# Patient Record
Sex: Male | Born: 1965
Health system: Southern US, Community
[De-identification: ages and names within clinical notes are randomized; demographics above are authoritative.]

## PROBLEM LIST (undated history)

## (undated) DIAGNOSIS — K219 Gastro-esophageal reflux disease without esophagitis: Secondary | ICD-10-CM

## (undated) DIAGNOSIS — T7840XA Allergy, unspecified, initial encounter: Secondary | ICD-10-CM

## (undated) DIAGNOSIS — M1990 Unspecified osteoarthritis, unspecified site: Secondary | ICD-10-CM

## (undated) HISTORY — PX: OTHER SURGICAL HISTORY: SHX169

## (undated) HISTORY — DX: Allergy, unspecified, initial encounter: T78.40XA

## (undated) HISTORY — PX: NO PAST SURGERIES: SHX2092

## (undated) HISTORY — DX: Gastro-esophageal reflux disease without esophagitis: K21.9

---

## 2006-03-04 ENCOUNTER — Ambulatory Visit: Payer: Self-pay | Admitting: Family Medicine

## 2007-06-25 ENCOUNTER — Ambulatory Visit: Payer: Self-pay | Admitting: Family Medicine

## 2007-06-25 ENCOUNTER — Encounter: Payer: Self-pay | Admitting: Family Medicine

## 2007-06-25 DIAGNOSIS — R209 Unspecified disturbances of skin sensation: Secondary | ICD-10-CM | POA: Insufficient documentation

## 2007-08-08 ENCOUNTER — Ambulatory Visit: Payer: Self-pay | Admitting: Family Medicine

## 2007-08-08 DIAGNOSIS — R0602 Shortness of breath: Secondary | ICD-10-CM

## 2007-12-05 ENCOUNTER — Emergency Department (HOSPITAL_COMMUNITY): Admission: EM | Admit: 2007-12-05 | Discharge: 2007-12-05 | Payer: Self-pay | Admitting: Emergency Medicine

## 2008-10-01 ENCOUNTER — Telehealth: Payer: Self-pay | Admitting: Family Medicine

## 2008-10-26 ENCOUNTER — Ambulatory Visit: Payer: Self-pay | Admitting: Family Medicine

## 2008-10-26 DIAGNOSIS — L299 Pruritus, unspecified: Secondary | ICD-10-CM | POA: Insufficient documentation

## 2008-10-26 DIAGNOSIS — J40 Bronchitis, not specified as acute or chronic: Secondary | ICD-10-CM | POA: Insufficient documentation

## 2008-10-27 LAB — CONVERTED CEMR LAB
ALT: 22 units/L (ref 0–53)
AST: 22 units/L (ref 0–37)
Bilirubin, Direct: 0.1 mg/dL (ref 0.0–0.3)
Indirect Bilirubin: 0.3 mg/dL (ref 0.0–0.9)
Total Protein: 6.9 g/dL (ref 6.0–8.3)

## 2009-02-24 ENCOUNTER — Telehealth: Payer: Self-pay | Admitting: Family Medicine

## 2009-07-08 ENCOUNTER — Ambulatory Visit: Payer: Self-pay | Admitting: Family Medicine

## 2009-07-08 DIAGNOSIS — H6500 Acute serous otitis media, unspecified ear: Secondary | ICD-10-CM

## 2010-04-26 ENCOUNTER — Ambulatory Visit: Payer: Self-pay | Admitting: Family Medicine

## 2010-04-26 DIAGNOSIS — J45909 Unspecified asthma, uncomplicated: Secondary | ICD-10-CM | POA: Insufficient documentation

## 2010-04-26 DIAGNOSIS — L821 Other seborrheic keratosis: Secondary | ICD-10-CM | POA: Insufficient documentation

## 2010-05-24 ENCOUNTER — Ambulatory Visit: Payer: Self-pay | Admitting: Family Medicine

## 2010-06-08 ENCOUNTER — Encounter: Payer: Self-pay | Admitting: Family Medicine

## 2010-12-05 NOTE — Assessment & Plan Note (Signed)
Summary: asthma flare up   Vital Signs:  Patient profile:   45 year old male Height:      71 inches Weight:      188 pounds BMI:     26.32 O2 Sat:      96 % on Room air Temp:     98.4 degrees F oral Pulse rate:   64 / minute BP sitting:   106 / 70  (left arm) Cuff size:   large  Vitals Entered By: Payton Spark CMA (April 26, 2010 8:39 AM)  O2 Flow:  Room air  Serial Vital Signs/Assessments:  Comments: 8:40 AM Peak Flow [address] By: Payton Spark CMA   CC: Wheezing and cough x 3 weeks.   Primary Care Provider:  Nani Gasser  CC:  Wheezing and cough x 3 weeks.Marland Kitchen  History of Present Illness: 45 yo WM presents for dry cough and wheezing in the chest x 3 wks.  He denies any sore throat or runny nose.  He denies fevers but has had nightsweats with cough.  He has been coughing a lot at night.  No hemoptysis or sputum.  He has hx of seasonal allergies and asthma.  He takes Zyrtec each day.  He is not getting much relief from his inhaler.  He has some DOE.  Quit smoking in 89.      Current Medications (verified): 1)  Proventil Hfa 108 (90 Base) Mcg/act Aers (Albuterol Sulfate) .... 2-4 Puffs Inhaled Three Times A Day As Needed Sob. 2)  Zyrtec Allergy 10 Mg Tabs (Cetirizine Hcl) .Marland Kitchen.. 1 Tab By Mouth Once Daily  Allergies (verified): No Known Drug Allergies  Past History:  Past Medical History: seasonal allergies/ asthma  Social History: Reviewed history from 08/13/2006 and no changes required. 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  Quit smoking 1989, 2-4 EtOh/wk, no drugs, 1-3 caffeinated drinks/week, no reg exercise.  Review of Systems      See HPI  Physical Exam  General:  alert, well-developed, well-nourished, and well-hydrated.   Head:  normocephalic and atraumatic.   Eyes:  conjunctiva clear, wears glasses Ears:  EACs patent; TMs translucent and gray with good cone of light and bony  landmarks.  Nose:  no nasal discharge.   Mouth:  good dentition and pharynx pink and moist.  slight injection Neck:  no masses.   Lungs:  dry hacking cough, non labored with + bibasilar exp wheezing.  no rhonchi Heart:  normal rate, regular rhythm, and no murmur.   Skin:  color normal and no rashes.   1.6 cm brown flat speckled macule over the R temple Cervical Nodes:  No lymphadenopathy noted   Impression & Recommendations:  Problem # 1:  ASTHMA, SEASONAL (ICD-493.90)  Treat with Depo Medrol 80 mg IM x 1 now.  Start Dulera HFA (samples given) 1 puff two times a day.  PFs in the green zone (but he is worsening at night and w/ exertion).  Due to allergic component, he is to stay on Zyrtec each day.  Will RF his rescue inhaler to use 2 puffs 4 x a day for the next 10 days.  His updated medication list for this problem includes:    Proventil Hfa 108 (90 Base) Mcg/act Aers (Albuterol sulfate) .Marland Kitchen... 2-4 puffs inhaled three times a day as needed sob.  Orders: Depo- Medrol 80mg  (J1040) Admin of Therapeutic Inj  intramuscular or subcutaneous (16109) Peak Flow Rate (  94150)  Problem # 2:  SEBORRHEIC KERATOSIS (ICD-702.19)  Larger flat SK over R temple.  Will refer to derm to check this.  Orders: Dermatology Referral (Derma)  Complete Medication List: 1)  Proventil Hfa 108 (90 Base) Mcg/act Aers (Albuterol sulfate) .... 2-4 puffs inhaled three times a day as needed sob. 2)  Zyrtec Allergy 10 Mg Tabs (Cetirizine hcl) .Marland Kitchen.. 1 tab by mouth once daily 3)  Ventolin Hfa 108 (90 Base) Mcg/act Aers (Albuterol sulfate) .... 2 puffs 4 x a day  Patient Instructions: 1)  For seasonal asthma--> 2)  Treat with Depo Medrol 80 mg IM today. 3)  Start DULERA inhaler 1 puff two times a day until follow up. 4)  Use Ventolin rescue inhaler 2 puffs 4 x a day for the next 10 days then move to AS NEEDED. 5)  Return for f/u asthma in 3-4 wks. Prescriptions: VENTOLIN HFA 108 (90 BASE) MCG/ACT AERS (ALBUTEROL  SULFATE) 2 puffs 4 x a day  #1 x 1   Entered and Authorized by:   Seymour Bars DO   Signed by:   Seymour Bars DO on 04/26/2010   Method used:   Electronically to        [facility] [phone]* (retail)       [address]       [place], [place]  [address]       Ph: [phone] or [phone]       Fax: [phone]   RxID:   [id number]    Medication Administration  Injection # 1:    Medication: Depo- Medrol 80mg     Diagnosis: ASTHMA, SEASONAL (ICD-493.90)    Route: IM    Site: RUOQ gluteus    Exp Date: 09/2010    Lot #: [id number]    Patient tolerated injection without complications    Given by: Payton Spark CMA (April 26, 2010 9:16 AM)  Orders Added: 1)  Est. Patient Level III [93716] 2)  Depo- Medrol 80mg  [J1040] 3)  Admin of Therapeutic Inj  intramuscular or subcutaneous [96372] 4)  Dermatology Referral [Derma] 5)  Peak Flow Rate [94150]

## 2010-12-05 NOTE — Consult Note (Signed)
Summary: [facility]   Imported By: Lanelle Bal 06/27/2010 11:43:46  _____________________________________________________________________  External Attachment:    Type:   Image     Comment:   External Document

## 2010-12-05 NOTE — Assessment & Plan Note (Signed)
Summary: f/u asthma   Vital Signs:  Patient profile:   45 year old male Height:      71 inches Weight:      188 pounds BMI:     26.32 O2 Sat:      96 % on Room air Temp:     98.1 degrees F oral Pulse rate:   74 / minute BP sitting:   113 / 75  (left arm) Cuff size:   large  Vitals Entered By: Payton Spark CMA (May 24, 2010 8:08 AM)  O2 Flow:  Room air  Serial Vital Signs/Assessments:  Comments: 8:08 AM Peak Flow [address] By: Payton Spark CMA   CC: F/U asthma.    Primary Care Provider:  Nani Gasser  CC:  F/U asthma. .  History of Present Illness: 45 yo WM presents for f/u asthma.  Started on Ahmeek 1 month ago which has helped.  Staying on the Zyrtec.  Allergies have been worse wtih the heat which is triggering his asthma.  Using Albuterol HFA 2-3 x a day.  Denies nocturnal cough or DOE.  Still wheezes sometimes.  Has some postnasal drip and dry daytime cough.   Current Medications (verified): 1)  Proventil Hfa 108 (90 Base) Mcg/act Aers (Albuterol Sulfate) .... 2-4 Puffs Inhaled Three Times A Day As Needed Sob. 2)  Zyrtec Allergy 10 Mg Tabs (Cetirizine Hcl) .Marland Kitchen.. 1 Tab By Mouth Once Daily 3)  Ventolin Hfa 108 (90 Base) Mcg/act Aers (Albuterol Sulfate) .... 2 Puffs 4 X A Day  Allergies (verified): No Known Drug Allergies  Past History:  Past Medical History: Reviewed history from 04/26/2010 and no changes required. seasonal allergies/ asthma  Social History: Reviewed history from 08/13/2006 and no changes required. 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  Quit smoking 1989, 2-4 EtOh/wk, no drugs, 1-3 caffeinated drinks/week, no reg exercise.  Review of Systems      See HPI  Physical Exam  General:  alert, well-developed, well-nourished, and well-hydrated.   Head:  normocephalic and atraumatic.  sinuses NTTP Eyes:  conjunctiva clear Nose:  scant rhinorrhea Mouth:  o/p pink and moist,  slight injection with postnasal drip Neck:  no masses.   Chest Wall:  no tenderness.   Lungs:  Normal respiratory effort, chest expands symmetrically. Lungs are clear to auscultation, no crackles or wheezes.  + forced exp wheeze Heart:  Normal rate and regular rhythm. S1 and S2 normal without gallop, murmur, click, rub or other extra sounds. Skin:  color normal.   Cervical Nodes:  No lymphadenopathy noted   Impression & Recommendations:  Problem # 1:  ASTHMA, SEASONAL (ICD-493.90)  Seasonal asthma flare from allergies.  Continue Dulera - 2 puffs two times a day for maintenance with use of Zyrtec + sample of Veramyst for allergies.  RF Albuterol HFA for rescue.  PFs in green zone.  Plans to get flu shot at work this fall.  F/U in 3 mos. The following medications were removed from the medication list:    Proventil Hfa 108 (90 Base) Mcg/act Aers (Albuterol sulfate) .Marland Kitchen... 2-4 puffs inhaled three times a day as needed sob. His updated medication list for this problem includes:    Ventolin Hfa 108 (90 Base) Mcg/act Aers (Albuterol sulfate) .Marland Kitchen... 2 puffs q 4-6 hrs prn    Dulera 100-5 Mcg/act Aero (Mometasone furo-formoterol fum) .Marland Kitchen... 2 puffs two times a day (rinse mouth out after each use)  Orders: Peak Flow Rate (94150)  Complete Medication List: 1)  Zyrtec Allergy 10 Mg Tabs (Cetirizine hcl) .Marland Kitchen.. 1 tab by mouth once daily 2)  Ventolin Hfa 108 (90 Base) Mcg/act Aers (Albuterol sulfate) .... 2 puffs q 4-6 hrs prn 3)  Dulera 100-5 Mcg/act Aero (Mometasone furo-formoterol fum) .... 2 puffs two times a day (rinse mouth out after each use) 4)  Veramyst 27.5 Mcg/spray Susp (Fluticasone furoate) .... 2 sprays per nostril daily  Patient Instructions: 1)  Stay on Dulera 2 puffs 2 x a day everyday for asthma maintenance. 2)  Use Ventolin resue inhaler as needed. 3)  Stay on zyrtec each night. 4)  Add Veramyst 2 sprays/ nostril daily (sample). 5)  Return for f/u asthma/ flu shot in 3  mos. Prescriptions: VENTOLIN HFA 108 (90 BASE) MCG/ACT AERS (ALBUTEROL SULFATE) 2 puffs q 4-6 hrs prn  #2 x 0   Entered and Authorized by:   Seymour Bars DO   Signed by:   Seymour Bars DO on 05/24/2010   Method used:   [facility]ron[facility] to        [facility] [phone][facility] (retail)       [address]       [place], [place]  [address]       Ph: [phone] or [phone]       Fax: [phone]   RxID:   [id number]

## 2011-10-08 ENCOUNTER — Emergency Department (INDEPENDENT_AMBULATORY_CARE_PROVIDER_SITE_OTHER)
Admission: EM | Admit: 2011-10-08 | Discharge: 2011-10-08 | Disposition: A | Discharge status: Home / Self Care | Payer: BC Managed Care – PPO | Source: Home / Self Care | Attending: Emergency Medicine | Admitting: Emergency Medicine

## 2011-10-08 ENCOUNTER — Encounter: Payer: Self-pay | Admitting: *Deleted

## 2011-10-08 DIAGNOSIS — J209 Acute bronchitis, unspecified: Secondary | ICD-10-CM

## 2011-10-08 DIAGNOSIS — J111 Influenza due to unidentified influenza virus with other respiratory manifestations: Secondary | ICD-10-CM

## 2011-10-08 HISTORY — DX: Unspecified osteoarthritis, unspecified site: M19.90

## 2011-10-08 MED ORDER — OSELTAMIVIR PHOSPHATE 75 MG PO CAPS: ORAL_CAPSULE | ORAL | Status: AC

## 2011-10-08 MED ORDER — PROMETHAZINE-CODEINE 6.25-10 MG/5ML PO SYRP: ORAL_SOLUTION | ORAL | Status: AC

## 2011-10-08 MED ORDER — FLUTICASONE PROPIONATE 50 MCG/ACT NA SUSP: NASAL | Status: DC

## 2011-10-08 MED ORDER — CLARITHROMYCIN 500 MG PO TABS: ORAL_TABLET | ORAL | Status: AC

## 2011-10-08 MED ORDER — ALBUTEROL SULFATE HFA 108 (90 BASE) MCG/ACT IN AERS: 2.0000 | INHALATION_SPRAY | RESPIRATORY_TRACT | Status: DC | PRN

## 2011-10-08 NOTE — ED Provider Notes (Signed)
History     CSN: [id number] Arrival date & time: 10/08/2011 10:17 AM   First MD Initiated Contact with Patient 10/08/11 1054      Chief Complaint  Patient presents with  . Cough    with SOB at rest  . Generalized Body Aches    (Consider location/radiation/quality/duration/timing/severity/associated sxs/prior treatment) HPI Comments: HPI : Flu symptoms for about 1 day. Fever to 102 with chills, sweats, myalgias, fatigue, headache. Symptoms are progressively worsening, despite trying OTC fever reducing medicine and rest and fluids. Has decreased appetite, but tolerating some liquids by mouth. No history of recent tick bite.  Review of Systems: Positive for fatigue, mild nasal congestion, mild sore throat, mild swollen anterior neck glands, mild cough. Negative for acute vision changes, stiff neck, focal weakness, syncope, seizures, respiratory distress, vomiting, diarrhea, GU symptoms.  He has a history of exercise induced and seasonal asthma. Does not use any asthma medicines regularly. However over the past day, has used albuterol inhaler 2-3 times daily as needed for wheezing. His wheezing today has been very minimal, denies significant shortness of breath.  Patient is a 45 y.o. male presenting with cough. The history is provided by the patient.  Cough The cough is productive of sputum. The maximum temperature recorded prior to his arrival was 100 to 100.9 F.    Past Medical History  Diagnosis Date  . Arthritis   . Asthma     History reviewed. No pertinent past surgical history.  Family History  Problem Relation Age of Onset  . Diabetes Mother     History  Substance Use Topics  . Smoking status: Former Smoker    Quit date: 10/07/1988  . Smokeless tobacco: Not on file  . Alcohol Use: Yes     5/week      Review of Systems  Respiratory: Positive for cough.     Allergies  Review of patient's allergies indicates no known allergies.  Home Medications    Current Outpatient Rx  Name Route Sig Dispense Refill  . ALBUTEROL SULFATE (2.5 MG/3ML) 0.083% IN NEBU Nebulization Take 2.5 mg by nebulization every 6 (six) hours as needed.        BP 124/79  Pulse 100  Temp(Src) 99.9 F (37.7 C) (Oral)  Resp 18  Ht 5' 11.5" (1.816 m)  Wt 186 lb (84.369 kg)  BMI 25.58 kg/m2  SpO2 97%  Physical Exam  Nursing note and vitals reviewed. Constitutional: He appears well-developed and well-nourished.  Non-toxic appearance. He appears ill (very fatigued, but no cardiorespiratory distress). No distress.  HENT:  Head: Normocephalic and atraumatic.  Right Ear: Tympanic membrane and external ear normal.  Left Ear: Tympanic membrane and external ear normal.  Nose: Rhinorrhea present.  Mouth/Throat: Mucous membranes are normal. Posterior oropharyngeal erythema (mild redness ) present. No oropharyngeal exudate.  Eyes: Conjunctivae are normal. Right eye exhibits no discharge. Left eye exhibits no discharge. No scleral icterus.  Neck: Neck supple.  Cardiovascular: Normal rate, regular rhythm and normal heart sounds.   Pulmonary/Chest: No stridor. No respiratory distress. He has wheezes (Rarely expiratory wheezes, but good air movement). He has rhonchi (Mild anterior rhonchi). He has no rales.  Abdominal: Soft. There is no tenderness.  Musculoskeletal: He exhibits no edema.  Lymphadenopathy:    He has cervical adenopathy (mild shoddy anterior cervical nodes).  Neurological: He is alert.  Skin: Skin is warm and intact. No rash noted. He is diaphoretic.  Psychiatric: He has a normal mood and affect.    ED  Course  Procedures (including critical care time)  Labs Reviewed - No data to display No results found.   No diagnosis found.    MDM  He likely has influenza, as there is recently been outbreak of influenza. We discussed options and he prefers not to run flu test and I am agreeable to prescribe Tamiflu. Also, given his mild asthma and clinically  has mild acute bronchitis, will cover bacterial causes with Biaxin. I refilled his albuterol inhaler. Phenergan with codeine cough syrup. Other symptomatic care discussed. Followup with Dr. Eppie Gibson within one week, sooner if worse or new symptoms.        Lonell Face, MD 10/08/11 [phone]

## 2011-11-29 ENCOUNTER — Encounter: Payer: Self-pay | Admitting: Family Medicine

## 2011-11-29 ENCOUNTER — Ambulatory Visit (INDEPENDENT_AMBULATORY_CARE_PROVIDER_SITE_OTHER): Payer: BC Managed Care – PPO | Admitting: Family Medicine

## 2011-11-29 VITALS — BP 136/82 | HR 84 | Temp 98.1°F | Ht 71.0 in | Wt 186.0 lb

## 2011-11-29 DIAGNOSIS — R21 Rash and other nonspecific skin eruption: Secondary | ICD-10-CM

## 2011-11-29 DIAGNOSIS — L309 Dermatitis, unspecified: Secondary | ICD-10-CM

## 2011-11-29 DIAGNOSIS — L259 Unspecified contact dermatitis, unspecified cause: Secondary | ICD-10-CM

## 2011-11-29 MED ORDER — EUCERIN EX CREA: TOPICAL_CREAM | CUTANEOUS | Status: DC | PRN

## 2011-11-29 MED ORDER — MOMETASONE FUROATE 0.1 % EX CREA: TOPICAL_CREAM | CUTANEOUS | Status: AC

## 2011-11-29 NOTE — Progress Notes (Signed)
Subjective:     Patient ID: Harry Lewis, male   DOB: [birth year], 46 y.o.   MRN: [id number]  Rash This is a new (Rash will reoccur yaerly durning the winter but this year a lot worse more of a patch) problem. The current episode started 1 to 4 weeks ago (3 weeks ). The problem has been gradually worsening since onset. The affected locations include the left lower leg and right lower leg. The rash is characterized by dryness, redness and itchiness. He was exposed to nothing. Pertinent negatives include no anorexia, congestion, cough, diarrhea, eye pain, facial edema, fatigue, fever, joint pain, nail changes, rhinorrhea, shortness of breath, sore throat or vomiting. Past treatments include nothing. His past medical history is significant for asthma. There is no history of allergies, eczema or varicella.     Review of Systems  Constitutional: Negative for fever and fatigue.  HENT: Negative for congestion, sore throat and rhinorrhea.   Eyes: Negative for pain.  Respiratory: Negative for cough and shortness of breath.   Gastrointestinal: Negative for vomiting, diarrhea and anorexia.  Musculoskeletal: Negative for joint pain.  Skin: Positive for rash. Negative for nail changes.  All other systems reviewed and are negative.       Objective:   Physical Exam  Skin: Skin is warm and dry. Rash noted. There is erythema.       Patch like rash w/ excorations on the L leg w/some areas on the R leg as well.  Psychiatric: He has a normal mood and affect. His behavior is normal.       Assessment:     eczema    Plan:     Eucerin cram and elcocon

## 2011-11-29 NOTE — Patient Instructions (Signed)

## 2013-03-03 ENCOUNTER — Other Ambulatory Visit: Payer: Self-pay | Admitting: *Deleted

## 2013-03-03 MED ORDER — ALBUTEROL SULFATE HFA 108 (90 BASE) MCG/ACT IN AERS: 2.0000 | INHALATION_SPRAY | RESPIRATORY_TRACT | Status: DC | PRN

## 2013-11-19 ENCOUNTER — Emergency Department
Admission: EM | Admit: 2013-11-19 | Discharge: 2013-11-19 | Disposition: A | Discharge status: Home / Self Care | Payer: BC Managed Care – PPO | Source: Home / Self Care | Attending: Family Medicine | Admitting: Family Medicine

## 2013-11-19 ENCOUNTER — Encounter: Payer: Self-pay | Admitting: Emergency Medicine

## 2013-11-19 DIAGNOSIS — J069 Acute upper respiratory infection, unspecified: Secondary | ICD-10-CM

## 2013-11-19 DIAGNOSIS — J9801 Acute bronchospasm: Secondary | ICD-10-CM

## 2013-11-19 DIAGNOSIS — J40 Bronchitis, not specified as acute or chronic: Secondary | ICD-10-CM

## 2013-11-19 MED ORDER — METHYLPREDNISOLONE ACETATE 80 MG/ML IJ SUSP
80.0000 mg | Freq: Once | INTRAMUSCULAR | Status: AC
  Administered 2013-11-19: 80 mg via INTRAMUSCULAR

## 2013-11-19 MED ORDER — AZITHROMYCIN 250 MG PO TABS: ORAL_TABLET | ORAL | Status: DC

## 2013-11-19 MED ORDER — ALBUTEROL SULFATE HFA 108 (90 BASE) MCG/ACT IN AERS: 2.0000 | INHALATION_SPRAY | RESPIRATORY_TRACT | Status: DC | PRN

## 2013-11-19 NOTE — ED Notes (Signed)
Patient c/o dry cough, rhinitis, sob and fatigue x 6 days. Patient states he has tried otc Robitussin DM, Nyquil and Mucinex without relief.

## 2013-11-19 NOTE — ED Provider Notes (Signed)
CSN: [id number]     Arrival date & time 11/19/13  1732 History   First MD Initiated Contact with Patient 11/19/13 1743     Chief Complaint  Patient presents with  . Cough  . Shortness of Breath    HPI  URI Symptoms Onset: 7 days  Description: rhinorrhea, nasal congestion, cough, chest tightness Modifying factors:  Baseline hx/o asthma use, no wheezing, + chest tightness   Symptoms Nasal discharge: yes Fever: no Sore throat: no Cough: yes Wheezing: no Ear pain: no GI symptoms: no Sick contacts: no  Red Flags  Stiff neck: no Dyspnea: minimal  Rash: no Swallowing difficulty: no  Sinusitis Risk Factors Headache/face pain: no Double sickening: no tooth pain: no  Allergy Risk Factors Sneezing: no Itchy scratchy throat: no Seasonal symptoms: no  Flu Risk Factors Headache: no muscle aches: no severe fatigue: no   Past Medical History  Diagnosis Date  . Arthritis   . Asthma    History reviewed. No pertinent past surgical history. Family History  Problem Relation Age of Onset  . Diabetes Mother   . Stroke Mother    History  Substance Use Topics  . Smoking status: Former Smoker    Quit date: 10/07/1988  . Smokeless tobacco: Not on file  . Alcohol Use: Yes     Comment: 5/week    Review of Systems  All other systems reviewed and are negative.    Allergies  Review of patient's allergies indicates no known allergies.  Home Medications   Current Outpatient Rx  Name  Route  Sig  Dispense  Refill  . albuterol (PROVENTIL HFA;VENTOLIN HFA) 108 (90 BASE) MCG/ACT inhaler   Inhalation   Inhale 2 puffs into the lungs every 4 (four) hours as needed for wheezing.   18 g   0   . albuterol (PROVENTIL) (2.5 MG/3ML) 0.083% nebulizer solution   Nebulization   Take 2.5 mg by nebulization every 6 (six) hours as needed.           Marland Kitchen EXPIRED: fluticasone (FLONASE) 50 MCG/ACT nasal spray      1 or 2 sprays each nostril twice a day   16 g   0   . EXPIRED:  Skin Protectants, Misc. (EUCERIN) cream   Topical   Apply topically as needed for dry skin. Take 16 oz of Eucerin cream and mix w/elocon cream   454 g   2    BP 128/86  Pulse 81  Temp(Src) 98.8 F (37.1 C) (Oral)  Resp 20  Ht 5\' 11"  (1.803 m)  Wt 189 lb 4 oz (85.843 kg)  BMI 26.41 kg/m2  SpO2 98% Physical Exam  Constitutional: He is oriented to person, place, and time. He appears well-developed and well-nourished.  HENT:  Head: Normocephalic and atraumatic.  Right Ear: External ear normal.  Left Ear: External ear normal.  +nasal erythema, rhinorrhea bilaterally, + post oropharyngeal erythema    Eyes: Conjunctivae are normal. Pupils are equal, round, and reactive to light.  Neck: Normal range of motion. Neck supple.  Cardiovascular: Normal rate, regular rhythm and normal heart sounds.   Pulmonary/Chest: Effort normal and breath sounds normal.  Abdominal: Soft.  Musculoskeletal: Normal range of motion.  Neurological: He is alert and oriented to person, place, and time.  Skin: Skin is warm.    ED Course  Procedures (including critical care time) Labs Review Labs Reviewed - No data to display Imaging Review No results found.  EKG Interpretation    Date/Time:  Ventricular Rate:    PR Interval:    QRS Duration:   QT Interval:    QTC Calculation:   R Axis:     Text Interpretation:              MDM   1. URI (upper respiratory infection)   2. Bronchitis   3. Bronchospasm    WIll treat with zpak Depomedrol 80mg  IM x1 given bronchospasmodic component to sxs.  Prn albuterol refilled.  Discussed supportive care and infectious/resp red flags.  Follow up as needed.     The patient and/or caregiver has been counseled thoroughly with regard to treatment plan and/or medications prescribed including dosage, schedule, interactions, rationale for use, and possible side effects and they verbalize understanding. Diagnoses and expected course of recovery discussed  and will return if not improved as expected or if the condition worsens. Patient and/or caregiver verbalized understanding.         Shanda Howells, MD 11/19/13 [phone]

## 2014-10-11 ENCOUNTER — Emergency Department
Admission: EM | Admit: 2014-10-11 | Discharge: 2014-10-11 | Disposition: A | Discharge status: Home / Self Care | Payer: BC Managed Care – PPO | Source: Home / Self Care | Attending: Family Medicine | Admitting: Family Medicine

## 2014-10-11 ENCOUNTER — Encounter: Payer: Self-pay | Admitting: *Deleted

## 2014-10-11 DIAGNOSIS — R197 Diarrhea, unspecified: Secondary | ICD-10-CM

## 2014-10-11 MED ORDER — DIPHENOXYLATE-ATROPINE 2.5-0.025 MG PO TABS: 1.0000 | ORAL_TABLET | Freq: Four times a day (QID) | ORAL | Status: DC | PRN

## 2014-10-11 MED ORDER — CIPROFLOXACIN HCL 500 MG PO TABS: 500.0000 mg | ORAL_TABLET | Freq: Two times a day (BID) | ORAL | Status: DC

## 2014-10-11 NOTE — Discharge Instructions (Signed)
Thank you for coming in today. Take Cipro twice daily for 5 days. Use Lomotil up to 4 times daily for diarrhea. Do not take if you have severe abdominal pain or blood in the stool. Return your stool kit ASAP  If your belly pain worsens, or you have high fever, bad vomiting, blood in your stool or black tarry stool go to the Emergency Room.   Diarrhea Diarrhea is frequent loose and watery bowel movements. It can cause you to feel weak and dehydrated. Dehydration can cause you to become tired and thirsty, have a dry mouth, and have decreased urination that often is dark yellow. Diarrhea is a sign of another problem, most often an infection that will not last long. In most cases, diarrhea typically lasts 2-3 days. However, it can last longer if it is a sign of something more serious. It is important to treat your diarrhea as directed by your caregiver to lessen or prevent future episodes of diarrhea. CAUSES  Some common causes include:  Gastrointestinal infections caused by viruses, bacteria, or parasites.  Food poisoning or food allergies.  Certain medicines, such as antibiotics, chemotherapy, and laxatives.  Artificial sweeteners and fructose.  Digestive disorders. HOME CARE INSTRUCTIONS  Ensure adequate fluid intake (hydration): Have 1 cup (8 oz) of fluid for each diarrhea episode. Avoid fluids that contain simple sugars or sports drinks, fruit juices, whole milk products, and sodas. Your urine should be clear or pale yellow if you are drinking enough fluids. Hydrate with an oral rehydration solution that you can purchase at pharmacies, retail stores, and online. You can prepare an oral rehydration solution at home by mixing the following ingredients together:   - tsp table salt.   tsp baking soda.   tsp salt substitute containing potassium chloride.  1  tablespoons sugar.  1 L (34 oz) of water.  Certain foods and beverages may increase the speed at which food moves through the  gastrointestinal (GI) tract. These foods and beverages should be avoided and include:  Caffeinated and alcoholic beverages.  High-fiber foods, such as raw fruits and vegetables, nuts, seeds, and whole grain breads and cereals.  Foods and beverages sweetened with sugar alcohols, such as xylitol, sorbitol, and mannitol.  Some foods may be well tolerated and may help thicken stool including:  Starchy foods, such as rice, toast, pasta, low-sugar cereal, oatmeal, grits, baked potatoes, crackers, and bagels.  Bananas.  Applesauce.  Add probiotic-rich foods to help increase healthy bacteria in the GI tract, such as yogurt and fermented milk products.  Wash your hands well after each diarrhea episode.  Only take over-the-counter or prescription medicines as directed by your caregiver.  Take a warm bath to relieve any burning or pain from frequent diarrhea episodes. SEEK IMMEDIATE MEDICAL CARE IF:   You are unable to keep fluids down.  You have persistent vomiting.  You have blood in your stool, or your stools are black and tarry.  You do not urinate in 6-8 hours, or there is only a small amount of very dark urine.  You have abdominal pain that increases or localizes.  You have weakness, dizziness, confusion, or light-headedness.  You have a severe headache.  Your diarrhea gets worse or does not get better.  You have a fever or persistent symptoms for more than 2-3 days.  You have a fever and your symptoms suddenly get worse. MAKE SURE YOU:   Understand these instructions.  Will watch your condition.  Will get help right away if  you are not doing well or get worse. Document Released: 10/12/2002 Document Revised: 03/08/2014 Document Reviewed: 06/29/2012 [facility] Patient Information 2015 [place], [place]. This information is not intended to replace advice given to you by your health care provider. Make sure you discuss any questions you have with your health care provider.

## 2014-10-11 NOTE — ED Notes (Signed)
Bandy c/o diarrhea x 3 days. Unable to hold any food or fluids. C/o chills. Taken imodium without relief.

## 2014-10-11 NOTE — ED Provider Notes (Signed)
Harry Lewis is a 48 y.o. male who presents to Urgent Care today for diarrhea. Patient has 3 day history of painless nonbloody diarrhea. No vomiting. No abdominal pain. No fevers. Patient does note chills. No other sick contacts. No recent travel or camping. No recent antibiotic exposure. He is drinking normally but eating causes frequent diarrhea. He has tried over-the-counter Imodium which has not helped.   Past Medical History  Diagnosis Date  . Arthritis   . Asthma    History reviewed. No pertinent past surgical history. History  Substance Use Topics  . Smoking status: Former Smoker    Quit date: 10/07/1988  . Smokeless tobacco: Never Used  . Alcohol Use: Yes     Comment: 5/week   ROS as above Medications: No current facility-administered medications for this encounter.   Current Outpatient Prescriptions  Medication Sig Dispense Refill  . albuterol (PROVENTIL HFA;VENTOLIN HFA) 108 (90 BASE) MCG/ACT inhaler Inhale 2 puffs into the lungs every 4 (four) hours as needed for wheezing or shortness of breath (cough, shortness of breath or wheezing.). 1 Inhaler 1  . ciprofloxacin (CIPRO) 500 MG tablet Take 1 tablet (500 mg total) by mouth every 12 (twelve) hours. 10 tablet 0  . diphenoxylate-atropine (LOMOTIL) 2.5-0.025 MG per tablet Take 1 tablet by mouth 4 (four) times daily as needed for diarrhea or loose stools. 30 tablet 0  . fluticasone (FLONASE) 50 MCG/ACT nasal spray 1 or 2 sprays each nostril twice a day 16 g 0  . Skin Protectants, Misc. (EUCERIN) cream Apply topically as needed for dry skin. Take 16 oz of Eucerin cream and mix w/elocon cream 454 g 2   No Known Allergies   Exam:  BP 121/81 mmHg  Pulse 72  Temp(Src) 97.7 F (36.5 C) (Oral)  Resp 16  Wt 184 lb (83.462 kg)  SpO2 98% Gen: Well NAD HEENT: EOMI,  MMM Lungs: Normal work of breathing. CTABL Heart: RRR no MRG Abd: NABS, Soft. Nondistended, Nontender no rebound or guarding or masses. Exts: Brisk capillary  refill, warm and well perfused.   No results found for this or any previous visit (from the past 24 hour(s)). No results found.  Assessment and Plan: 48 y.o. male with diarrhea. Unclear etiology. 3 days is a bit long for viral cause. Stool culture and C. difficile pending. Treatment with Cipro and Lomotil.  Discussed warning signs or symptoms. Please see discharge instructions. Patient expresses understanding.     Gregor Hams, MD 10/11/14 [phone]

## 2014-10-15 ENCOUNTER — Telehealth: Payer: Self-pay | Admitting: Emergency Medicine

## 2014-10-15 LAB — STOOL CULTURE

## 2015-03-28 ENCOUNTER — Encounter: Payer: Self-pay | Admitting: Family Medicine

## 2015-03-28 ENCOUNTER — Ambulatory Visit (INDEPENDENT_AMBULATORY_CARE_PROVIDER_SITE_OTHER): Payer: BLUE CROSS/BLUE SHIELD | Admitting: Family Medicine

## 2015-03-28 VITALS — BP 137/82 | HR 70 | Wt 189.0 lb

## 2015-03-28 DIAGNOSIS — Z0000 Encounter for general adult medical examination without abnormal findings: Secondary | ICD-10-CM | POA: Diagnosis not present

## 2015-03-28 NOTE — Patient Instructions (Addendum)
Colonoscopy A colonoscopy is an exam to look at the entire large intestine (colon). This exam can help find problems such as tumors, polyps, inflammation, and areas of bleeding. The exam takes about 1 hour.  LET [facility] CARE PROVIDER KNOW ABOUT:   Any allergies you have.  All medicines you are taking, including vitamins, herbs, eye drops, creams, and over-the-counter medicines.  Previous problems you or members of your family have had with the use of anesthetics.  Any blood disorders you have.  Previous surgeries you have had.  Medical conditions you have. RISKS AND COMPLICATIONS  Generally, this is a safe procedure. However, as with any procedure, complications can occur. Possible complications include:  Bleeding.  Tearing or rupture of the colon wall.  Reaction to medicines given during the exam.  Infection (rare). BEFORE THE PROCEDURE   Ask your health care provider about changing or stopping your regular medicines.  You may be prescribed an oral bowel prep. This involves drinking a large amount of medicated liquid, starting the day before your procedure. The liquid will cause you to have multiple loose stools until your stool is almost clear or light green. This cleans out your colon in preparation for the procedure.  Do not eat or drink anything else once you have started the bowel prep, unless your health care provider tells you it is safe to do so.  Arrange for someone to drive you home after the procedure. PROCEDURE   You will be given medicine to help you relax (sedative).  You will lie on your side with your knees bent.  A long, flexible tube with a light and camera on the end (colonoscope) will be inserted through the rectum and into the colon. The camera sends video back to a computer screen as it moves through the colon. The colonoscope also releases carbon dioxide gas to inflate the colon. This helps your health care provider see the area better.  During  the exam, your health care provider may take a small tissue sample (biopsy) to be examined under a microscope if any abnormalities are found.  The exam is finished when the entire colon has been viewed. AFTER THE PROCEDURE   Do not drive for 24 hours after the exam.  You may have a small amount of blood in your stool.  You may pass moderate amounts of gas and have mild abdominal cramping or bloating. This is caused by the gas used to inflate your colon during the exam.  Ask when your test results will be ready and how you will get your results. Make sure you get your test results. Document Released: 10/19/2000 Document Revised: 08/12/2013 Document Reviewed: 06/29/2013 [facility] Patient Information 2015 [place], [place]. This information is not intended to replace advice given to you by your health care provider. Make sure you discuss any questions you have with your health care provider.    Prostate-Specific Antigen The prostate-specific antigen (PSA) test is a blood test. It can be used to help diagnose prostate cancer in men whose physical exam results suggest the presence of prostate cancer. Some factors interfere with the results of the PSA. The factors listed below will either increase or decrease the PSA levels. They are:  Prescriptions used for male baldness.  Some herbs.  Active prostate infection.  Prior instrumentation or urinary catheterization.  Ejaculation up to 2 days prior to testing.  A noncancerous enlargement of the prostate.  Inflammation of the prostate.  Active urinary tract infection. If your test results  are high (elevated), your health care provider will discuss the results with you. He or she will also let you know if more evaluation is needed. PREPARATION FOR TEST No preparation or fasting is necessary. NORMAL FINDINGS Less than 4 ng/mL or less than 16mcg/L (SI units) Ranges for normal findings may vary among different labs and hospitals. You should  always check with your health care provider after having lab work or other tests done to discuss the meaning of your test results and whether your values are considered within normal limits. MEANING OF TEST  A normal value means prostate cancer is less likely. The chance of having prostate cancer increases if the value is between 4 ng/mL and 10 ng/mL. However, further testing will be needed. Values above 10 ng/mL suggest that there is a much higher chance of having prostate cancer (if the above situations that raise PSA are not present). Your health care provider will go over your test results with you and discuss the importance of this test. If this value is elevated, your health care provider may recommend further testing or evaluation. OBTAINING THE TEST RESULTS It is your responsibility to obtain your test results. Ask the lab or department performing the test when and how you will get your results. Document Released: 11/24/2004 Document Revised: 10/27/2013 Document Reviewed: 05/30/2007 [facility] Patient Information 2015 [place], [place]. This information is not intended to replace advice given to you by your health care provider. Make sure you discuss any questions you have with your health care provider.

## 2015-03-28 NOTE — Progress Notes (Signed)
Subjective:    Patient ID: Harry Lewis, male    DOB: [birth year], 49 y.o.   MRN: [id number]  [facility] Here for CPE - no specific concerns or complaints. He has not had a physical in almost 5 years. He did want to discuss when to start screening for colon cancer and prostate cancer. He denies any urinary symptoms.   Review of Systems  Comprehensive review of systems is negative.  BP 137/82 mmHg  Pulse 70  Wt 189 lb (85.73 kg)  SpO2 97%    No Known Allergies  Past Medical History  Diagnosis Date  . Arthritis   . Asthma     Past Surgical History  Procedure Laterality Date  . No past surgeries      History   Social History  . Marital Status: Married    Spouse Name: N/A  . Number of Children: 0  . Years of Education: N/A   Occupational History  . chemist     Social History Main Topics  . Smoking status: Former Smoker    Quit date: 10/07/1988  . Smokeless tobacco: Never Used  . Alcohol Use: 3.6 oz/week    6 Cans of beer per week     Comment: 5/week  . Drug Use: No  . Sexual Activity:    Partners: Female   Other Topics Concern  . Not on file   Social History Narrative   Occ execise , mostly on the weekend.     Family History  Problem Relation Age of Onset  . Diabetes Mother   . Stroke Mother   . Throat cancer Father     smoker    Outpatient Encounter Prescriptions as of 03/28/2015  Medication Sig  . albuterol (PROVENTIL HFA;VENTOLIN HFA) 108 (90 BASE) MCG/ACT inhaler Inhale 2 puffs into the lungs every 4 (four) hours as needed for wheezing or shortness of breath (cough, shortness of breath or wheezing.).  . [DISCONTINUED] ciprofloxacin (CIPRO) 500 MG tablet Take 1 tablet (500 mg total) by mouth every 12 (twelve) hours.  . [DISCONTINUED] diphenoxylate-atropine (LOMOTIL) 2.5-0.025 MG per tablet Take 1 tablet by mouth 4 (four) times daily as needed for diarrhea or loose stools.  . [DISCONTINUED] fluticasone (FLONASE) 50 MCG/ACT nasal spray 1 or 2 sprays  each nostril twice a day  . [DISCONTINUED] Skin Protectants, Misc. (EUCERIN) cream Apply topically as needed for dry skin. Take 16 oz of Eucerin cream and mix w/elocon cream   No facility-administered encounter medications on file as of 03/28/2015.           Objective:   Physical Exam  Constitutional: He is oriented to person, place, and time. He appears well-developed and well-nourished.  HENT:  Head: Normocephalic and atraumatic.  Right Ear: External ear normal.  Left Ear: External ear normal.  Nose: Nose normal.  Mouth/Throat: Oropharynx is clear and moist.  Eyes: Conjunctivae and EOM are normal. Pupils are equal, round, and reactive to light.  Neck: Normal range of motion. Neck supple. No thyromegaly present.  Cardiovascular: Normal rate, regular rhythm, normal heart sounds and intact distal pulses.   Pulmonary/Chest: Effort normal and breath sounds normal.  Abdominal: Soft. Bowel sounds are normal. He exhibits no distension and no mass. There is no tenderness. There is no rebound and no guarding.  Musculoskeletal: Normal range of motion.  Lymphadenopathy:    He has no cervical adenopathy.  Neurological: He is alert and oriented to person, place, and time. He has normal reflexes.  Skin: Skin is  warm and dry.  Psychiatric: He has a normal mood and affect. His behavior is normal. Judgment and thought content normal.          Assessment & Plan:  CPE Keep up a regular exercise program and make sure you are eating a healthy diet Try to eat 4 servings of dairy a day, or if you are lactose intolerant take a calcium with vitamin D daily.  Your vaccines are up to date.  Discussed starting colon cancer screening and prostate cancer screening next year at age 75. We did go ahead and review some of that information today and handout was provided about screening colonoscopy.

## 2015-04-06 LAB — COMPLETE METABOLIC PANEL WITH GFR
ALBUMIN: 3.8 g/dL (ref 3.5–5.2)
ALT: 21 U/L (ref 0–53)
AST: 24 U/L (ref 0–37)
Alkaline Phosphatase: 64 U/L (ref 39–117)
BILIRUBIN TOTAL: 0.8 mg/dL (ref 0.2–1.2)
BUN: 17 mg/dL (ref 6–23)
CO2: 24 mEq/L (ref 19–32)
Calcium: 9.2 mg/dL (ref 8.4–10.5)
Chloride: 104 mEq/L (ref 96–112)
Creat: 1.2 mg/dL (ref 0.50–1.35)
GFR, EST NON AFRICAN AMERICAN: 71 mL/min
GFR, Est African American: 82 mL/min
GLUCOSE: 95 mg/dL (ref 70–99)
Potassium: 4.1 mEq/L (ref 3.5–5.3)
Sodium: 141 mEq/L (ref 135–145)
TOTAL PROTEIN: 6.4 g/dL (ref 6.0–8.3)

## 2015-04-06 LAB — LIPID PANEL
CHOLESTEROL: 215 mg/dL — AB (ref 0–200)
HDL: 61 mg/dL (ref >=40)
LDL Cholesterol: 116 mg/dL — ABNORMAL HIGH (ref 0–99)
Total CHOL/HDL Ratio: 3.5 Ratio
Triglycerides: 191 mg/dL — ABNORMAL HIGH (ref <150)
VLDL: 38 mg/dL (ref 0–40)

## 2015-04-06 LAB — HIV ANTIBODY (ROUTINE TESTING W REFLEX): HIV 1&2 Ab, 4th Generation: NONREACTIVE

## 2015-04-26 ENCOUNTER — Telehealth: Payer: Self-pay | Admitting: *Deleted

## 2015-04-26 NOTE — Telephone Encounter (Signed)
Pt called and lvm requesting copy  Of his results. Results printed and mailed.Harry Lewis [place]

## 2015-11-09 ENCOUNTER — Other Ambulatory Visit: Payer: Self-pay | Admitting: Family Medicine

## 2016-09-11 ENCOUNTER — Other Ambulatory Visit: Payer: Self-pay | Admitting: Family Medicine

## 2016-09-14 ENCOUNTER — Emergency Department
Admission: EM | Admit: 2016-09-14 | Discharge: 2016-09-14 | Disposition: A | Discharge status: Home / Self Care | Payer: BLUE CROSS/BLUE SHIELD | Source: Home / Self Care | Attending: Family Medicine | Admitting: Family Medicine

## 2016-09-14 ENCOUNTER — Emergency Department (INDEPENDENT_AMBULATORY_CARE_PROVIDER_SITE_OTHER): Payer: BLUE CROSS/BLUE SHIELD

## 2016-09-14 ENCOUNTER — Encounter: Payer: Self-pay | Admitting: Emergency Medicine

## 2016-09-14 DIAGNOSIS — R0789 Other chest pain: Secondary | ICD-10-CM

## 2016-09-14 DIAGNOSIS — R0781 Pleurodynia: Secondary | ICD-10-CM

## 2016-09-14 DIAGNOSIS — J40 Bronchitis, not specified as acute or chronic: Secondary | ICD-10-CM

## 2016-09-14 DIAGNOSIS — Z87891 Personal history of nicotine dependence: Secondary | ICD-10-CM | POA: Diagnosis not present

## 2016-09-14 DIAGNOSIS — R05 Cough: Secondary | ICD-10-CM

## 2016-09-14 DIAGNOSIS — Z8709 Personal history of other diseases of the respiratory system: Secondary | ICD-10-CM | POA: Diagnosis not present

## 2016-09-14 MED ORDER — AZITHROMYCIN 250 MG PO TABS: ORAL_TABLET | ORAL | 0 refills | Status: DC

## 2016-09-14 MED ORDER — PREDNISONE 20 MG PO TABS: ORAL_TABLET | ORAL | 0 refills | Status: DC

## 2016-09-14 MED ORDER — BENZONATATE 200 MG PO CAPS: 200.0000 mg | ORAL_CAPSULE | Freq: Three times a day (TID) | ORAL | 0 refills | Status: DC | PRN

## 2016-09-14 NOTE — ED Provider Notes (Signed)
[facility]    CSN: [id number] Arrival date & time: 09/14/16  0955     History   Chief Complaint Chief Complaint  Patient presents with  . Cough    HPI Harry Lewis is a 50 y.o. male.   Eight days ago patient developed typical cold-like symptoms developing over several days, including mild sore throat, sinus congestion, headache, fatigue, and cough.  He improved initially, but his non-productive cough gradually worsened.  Last night he developed sudden sharp pleuritic pain in his left lateral inferior chest that has persisted.  He denies fevers, chills, and sweats, and no shortness of breath or wheezing.  He has a history of mild asthma that usually manifests only during URI's.  He has started using his albuterol inhaler over the past 2 to 3 days.   The history is provided by the patient.    Past Medical History:  Diagnosis Date  . Arthritis   . Asthma     Patient Active Problem List   Diagnosis Date Noted  . ASTHMA, SEASONAL 04/26/2010  . SEBORRHEIC KERATOSIS 04/26/2010  . PRURITUS 10/26/2008  . SOB 08/08/2007    Past Surgical History:  Procedure Laterality Date  . NO PAST SURGERIES         Home Medications    Prior to Admission medications   Medication Sig Start Date End Date Taking? Authorizing Provider  azithromycin (ZITHROMAX Z-PAK) 250 MG tablet Take 2 tabs today; then begin one tab once daily for 4 more days. 09/14/16   Kandra Nicolas, MD  benzonatate (TESSALON) 200 MG capsule Take 1 capsule (200 mg total) by mouth 3 (three) times daily as needed for cough. 09/14/16   Kandra Nicolas, MD  predniSONE (DELTASONE) 20 MG tablet Take one tab by mouth twice daily for 5 days, then one daily for 3 days. Take with food. 09/14/16   Kandra Nicolas, MD  PROAIR HFA 108 [phone] Base) MCG/ACT inhaler 2 PUFF EVERY 4 HR AS NEEDED 09/12/16   Hali Marry, MD    Family History Family History  Problem Relation Age of Onset  . Diabetes Mother   .  Stroke Mother   . Throat cancer Father     smoker    Social History Social History  Substance Use Topics  . Smoking status: Former Smoker    Quit date: 10/07/1988  . Smokeless tobacco: Never Used  . Alcohol use 3.6 oz/week    6 Cans of beer per week     Comment: 5/week     Allergies   Patient has no known allergies.   Review of Systems Review of Systems + sore throat + cough + pleuritic pain No wheezing + nasal congestion + post-nasal drainage No sinus pain/pressure No itchy/red eyes No earache No hemoptysis No SOB No fever/chills No nausea No vomiting No abdominal pain No diarrhea No urinary symptoms No skin rash + fatigue + myalgias + headache Used OTC meds without relief   Physical Exam Triage Vital Signs ED Triage Vitals  Enc Vitals Group     BP 09/14/16 1044 137/81     Pulse Rate 09/14/16 1044 72     Resp --      Temp 09/14/16 1044 98.2 F (36.8 C)     Temp Source 09/14/16 1044 Oral     SpO2 09/14/16 1044 96 %     Weight 09/14/16 1045 191 lb (86.6 kg)     Height 09/14/16 1045 5\' 11"  (1.803 m)  Head Circumference --      Peak Flow --      Pain Score 09/14/16 1053 5     Pain Loc --      Pain Edu? --      Excl. in [place]? --    No data found.   Updated Vital Signs BP 137/81 (BP Location: Left Arm)   Pulse 72   Temp 98.2 F (36.8 C) (Oral)   Ht 5\' 11"  (1.803 m)   Wt 191 lb (86.6 kg)   SpO2 96%   BMI 26.64 kg/m   Visual Acuity Right Eye Distance:   Left Eye Distance:   Bilateral Distance:    Right Eye Near:   Left Eye Near:    Bilateral Near:     Physical Exam  Constitutional: He appears well-developed and well-nourished. No distress.  HENT:  Head: Normocephalic.  Right Ear: Tympanic membrane, external ear and ear canal normal.  Left Ear: Tympanic membrane, external ear and ear canal normal.  Nose: Nose normal.  Mouth/Throat: Oropharynx is clear and moist.  Eyes: Conjunctivae are normal. Pupils are equal, round, and  reactive to light.  Neck: Neck supple.  Lateral nodes tender to palpation  Cardiovascular: Normal heart sounds.   Pulmonary/Chest: Breath sounds normal.     He exhibits tenderness.    There is mild tenderness to palpation left lateral inferior chest as noted on diagram.   Abdominal: Soft. There is no tenderness.  Musculoskeletal: He exhibits tenderness. He exhibits no edema.  Lymphadenopathy:    He has cervical adenopathy.  Neurological: He is alert.  Skin: Skin is warm and dry.  Nursing note and vitals reviewed.    UC Treatments / Results  Labs (all labs ordered are listed, but only abnormal results are displayed) Labs Reviewed - No data to display  EKG  EKG Interpretation None       Radiology Dg Ribs Unilateral W/chest Left  Result Date: 09/14/2016 CLINICAL DATA:  Began coughing 5 days ago and had onset of left ribcage pain last night. Former smoker. History of asthma. EXAM: LEFT RIBS AND CHEST - 3+ VIEW COMPARISON:  None in PACs FINDINGS: The lungs are adequately inflated. There is no focal infiltrate. There is no pleural effusion or pneumothorax or pneumomediastinum. The heart and pulmonary vascularity are normal. Left rib detail images reveal no acute displaced or nondisplaced lower rib fractures. IMPRESSION: There is no active cardiopulmonary disease. No acute rib fracture is observed. Electronically Signed   By: David  Martinique M.D.   On: 09/14/2016 11:30    Procedures Procedures (including critical care time)  Medications Ordered in UC Medications - No data to display   Initial Impression / Assessment and Plan / UC Course  I have reviewed the triage vital signs and the nursing notes.  Pertinent labs & imaging results that were available during my care of the patient were reviewed by me and considered in my medical decision making (see chart for details).  Clinical Course   Dispensed rib belt. Begin prednisone burst/taper. Begin Z-pak for atypical  coverage, and prescription written for Benzonatate (Tessalon) to take at bedtime for night-time cough (may take daytime initially until chest pain improves). Take plain guaifenesin (1200mg  extended release tabs such as Mucinex) twice daily, with plenty of water, for cough and congestion.  May add Pseudoephedrine (30mg , one or two every 4 to 6 hours) for sinus congestion.  Get adequate rest.   May use Afrin nasal spray (or generic oxymetazoline) twice daily for  about 5 days and then discontinue.  Also recommend using saline nasal spray several times daily and saline nasal irrigation (AYR is a common brand).  Use Flonase nasal spray each morning after using Afrin nasal spray and saline nasal irrigation. Try warm salt water gargles for sore throat.  Stop all antihistamines for now, and other non-prescription cough/cold preparations. Continue albuterol inhaler as needed. Try to limit Tessalon (benzonatate) to bedtime use for cough. May take Tylenol as needed for pain. Apply ice pack to left chest for 20 to 30 minutes, 2 to 3 times daily  Continue until pain decreases.  Do not wear rib belt continuously. Follow-up with family doctor if not improving about 7 to 10 days.      Final Clinical Impressions(s) / UC Diagnoses   Final diagnoses:  Bronchitis  Rib pain on left side    New Prescriptions New Prescriptions   AZITHROMYCIN (ZITHROMAX Z-PAK) 250 MG TABLET    Take 2 tabs today; then begin one tab once daily for 4 more days.   BENZONATATE (TESSALON) 200 MG CAPSULE    Take 1 capsule (200 mg total) by mouth 3 (three) times daily as needed for cough.   PREDNISONE (DELTASONE) 20 MG TABLET    Take one tab by mouth twice daily for 5 days, then one daily for 3 days. Take with food.     Kandra Nicolas, MD 09/16/16 [phone]

## 2016-09-14 NOTE — Discharge Instructions (Signed)
Take plain guaifenesin (1200mg  extended release tabs such as Mucinex) twice daily, with plenty of water, for cough and congestion.  May add Pseudoephedrine (30mg , one or two every 4 to 6 hours) for sinus congestion.  Get adequate rest.   May use Afrin nasal spray (or generic oxymetazoline) twice daily for about 5 days and then discontinue.  Also recommend using saline nasal spray several times daily and saline nasal irrigation (AYR is a common brand).  Use Flonase nasal spray each morning after using Afrin nasal spray and saline nasal irrigation. Try warm salt water gargles for sore throat.  Stop all antihistamines for now, and other non-prescription cough/cold preparations. Continue albuterol inhaler as needed. Try to limit Tessalon (benzonatate) to bedtime use for cough. May take Tylenol as needed for pain. Apply ice pack to left chest for 20 to 30 minutes, 2 to 3 times daily  Continue until pain decreases.  Do not wear rib belt continuously. Follow-up with family doctor if not improving about 7 to 10 days.

## 2016-09-14 NOTE — ED Triage Notes (Signed)
Cough x 8 days, asthma, lots of drainage, coughed really hard last night and felt a sharp pain in left lower back.

## 2016-09-16 ENCOUNTER — Encounter: Payer: Self-pay | Admitting: Emergency Medicine

## 2016-09-16 ENCOUNTER — Emergency Department
Admission: EM | Admit: 2016-09-16 | Discharge: 2016-09-16 | Disposition: A | Discharge status: Home / Self Care | Payer: BLUE CROSS/BLUE SHIELD | Source: Home / Self Care | Attending: Family Medicine | Admitting: Family Medicine

## 2016-09-16 DIAGNOSIS — M940 Chondrocostal junction syndrome [Tietze]: Secondary | ICD-10-CM

## 2016-09-16 DIAGNOSIS — J40 Bronchitis, not specified as acute or chronic: Secondary | ICD-10-CM | POA: Diagnosis not present

## 2016-09-16 LAB — POCT CBC W AUTO DIFF (K'VILLE URGENT CARE)

## 2016-09-16 MED ORDER — LEVOFLOXACIN 750 MG PO TABS: 750.0000 mg | ORAL_TABLET | Freq: Every day | ORAL | 0 refills | Status: DC

## 2016-09-16 MED ORDER — METHYLPREDNISOLONE SODIUM SUCC 125 MG IJ SOLR: 125.0000 mg | Freq: Once | INTRAMUSCULAR | Status: DC

## 2016-09-16 NOTE — Discharge Instructions (Signed)
Stop azithromycin.  Resume prednisone tomorrow.  Continue guaifenesin with plenty of fluids.  Continue Tessalon perle at bedtime for cough. If symptoms become significantly worse during the night or over the weekend, proceed to the local emergency room.

## 2016-09-16 NOTE — ED Provider Notes (Signed)
[facility]    CSN: [id number] Arrival date & time: 09/16/16  1715     History   Chief Complaint Chief Complaint  Patient presents with  . Nasal Congestion    HPI Harry Lewis is a 50 y.o. male.   Patient returns for follow-up, having been evaluated/treated two days ago for bronchitis/left rib pain with azithromycin, prednisone, benzonatate at bedtime, Mucinex, and advised to use his albuterol inhaler as needed.   He returns today reporting a sensation of tightness in his upper anterior chest that occurs with deep inspiration, such that he has difficulty taking a deep inspiration.  The tightness is worse if he leans forward.  The pain does not radiate.  He had had pain in his left lateral chest which he states has now decreased.  No shortness of breath.  No wheezing.  No fevers, chills, and sweats.   The history is provided by the patient.    Past Medical History:  Diagnosis Date  . Arthritis   . Asthma     Patient Active Problem List   Diagnosis Date Noted  . ASTHMA, SEASONAL 04/26/2010  . SEBORRHEIC KERATOSIS 04/26/2010  . PRURITUS 10/26/2008  . SOB 08/08/2007    Past Surgical History:  Procedure Laterality Date  . NO PAST SURGERIES         Home Medications    Prior to Admission medications   Medication Sig Start Date End Date Taking? Authorizing Provider  azithromycin (ZITHROMAX Z-PAK) 250 MG tablet Take 2 tabs today; then begin one tab once daily for 4 more days. 09/14/16   Kandra Nicolas, MD  benzonatate (TESSALON) 200 MG capsule Take 1 capsule (200 mg total) by mouth 3 (three) times daily as needed for cough. 09/14/16   Kandra Nicolas, MD  levofloxacin (LEVAQUIN) 750 MG tablet Take 1 tablet (750 mg total) by mouth daily. 09/16/16   Kandra Nicolas, MD  predniSONE (DELTASONE) 20 MG tablet Take one tab by mouth twice daily for 5 days, then one daily for 3 days. Take with food. 09/14/16   Kandra Nicolas, MD  PROAIR HFA 108 [phone] Base)  MCG/ACT inhaler 2 PUFF EVERY 4 HR AS NEEDED 09/12/16   Hali Marry, MD    Family History Family History  Problem Relation Age of Onset  . Diabetes Mother   . Stroke Mother   . Throat cancer Father     smoker    Social History Social History  Substance Use Topics  . Smoking status: Former Smoker    Quit date: 10/07/1988  . Smokeless tobacco: Never Used  . Alcohol use 3.6 oz/week    6 Cans of beer per week     Comment: 5/week     Allergies   Patient has no known allergies.   Review of Systems Review of Systems No sore throat + cough ? pleuritic pain No wheezing + nasal congestion ? post-nasal drainage No sinus pain/pressure No itchy/red eyes No earache No hemoptysis No SOB No fever/chills No nausea No vomiting No abdominal pain No diarrhea No urinary symptoms No skin rash + fatigue No myalgias No headache Used OTC meds without relief   Physical Exam Triage Vital Signs ED Triage Vitals  Enc Vitals Group     BP 09/16/16 1731 128/79     Pulse Rate 09/16/16 1731 78     Resp 09/16/16 1731 18     Temp 09/16/16 1731 98 F (36.7 C)     Temp Source  09/16/16 1731 Oral     SpO2 09/16/16 1731 97 %     Weight --      Height --      Head Circumference --      Peak Flow --      Pain Score 09/16/16 1732 0     Pain Loc --      Pain Edu? --      Excl. in [place]? --    No data found.   Updated Vital Signs BP 128/79 (BP Location: Left Arm)   Pulse 78   Temp 98 F (36.7 C) (Oral)   Resp 18   SpO2 97%   Visual Acuity Right Eye Distance:   Left Eye Distance:   Bilateral Distance:    Right Eye Near:   Left Eye Near:    Bilateral Near:     Physical Exam Nursing notes and Vital Signs reviewed. Appearance:  Patient appears stated age, and in no acute distress Eyes:  Pupils are equal, round, and reactive to light and accomodation.  Extraocular movement is intact.  Conjunctivae are not inflamed  Ears:  Canals normal.  Tympanic membranes normal.    Nose:  Mildly congested turbinates.  No sinus tenderness.   Pharynx:  Normal Neck:  Supple.  Tender mildly enlarged posterior/lateral nodes are palpated bilaterally  Lungs:  Clear to auscultation.  Breath sounds are equal.  Moving air well.  No anterior chest tenderness to palpation.  SpO2: 97% Heart:  Regular rate and rhythm without murmurs, rubs, or gallops.  Rate 78 Abdomen:  Nontender without masses or hepatosplenomegaly.  Bowel sounds are present.  No CVA or flank tenderness.  Extremities:  No edema.  No lower leg/calf tenderness or warmth. Skin:  No rash present.    UC Treatments / Results  Labs (all labs ordered are listed, but only abnormal results are displayed) Labs Reviewed  POCT CBC W AUTO DIFF ([place]) - Abnormal; Notable for the following:  WBC 12.9; LY 9.3; MO 2.4; GR 88.3; Hgb 15.7; Platelets 281     EKG  EKG Interpretation None       Radiology No results found.  Procedures Procedures (including critical care time)  Medications Ordered in UC Medications  methylPREDNISolone sodium succinate (SOLU-MEDROL) 125 mg/2 mL injection 125 mg (not administered)     Initial Impression / Assessment and Plan / UC Course  I have reviewed the triage vital signs and the nursing notes.  Pertinent labs & imaging results that were available during my care of the patient were reviewed by me and considered in my medical decision making (see chart for details).  Clinical Course   Note leukocytosis 12.9.  Normal heart rate and SpO2 97% reassuring (low risk for PE) Will switch to Levaquin for broader coverage. Administered Solumedrol 125mg  IM  Stop azithromycin.  Resume prednisone tomorrow.  Continue guaifenesin with plenty of fluids.  Continue Tessalon perle at bedtime for cough. If symptoms become significantly worse during the night or over the weekend, proceed to the local emergency room.  Followup with Family Doctor in one week.     Final Clinical  Impressions(s) / UC Diagnoses   Final diagnoses:  Bronchitis  Costochondritis, acute    New Prescriptions Current Discharge Medication List    START taking these medications   Details  levofloxacin (LEVAQUIN) 750 MG tablet Take 1 tablet (750 mg total) by mouth daily. Qty: 7 tablet, Refills: 0         Kandra Nicolas,  MD 09/17/16 1302

## 2016-09-16 NOTE — ED Triage Notes (Signed)
Was seen here in [facility] 2 days ago; no improvement.

## 2016-09-19 ENCOUNTER — Telehealth: Payer: Self-pay | Admitting: Emergency Medicine

## 2016-09-19 NOTE — Telephone Encounter (Signed)
Checking to see how he's feeling

## 2016-10-25 DIAGNOSIS — M2012 Hallux valgus (acquired), left foot: Secondary | ICD-10-CM | POA: Diagnosis not present

## 2016-10-25 DIAGNOSIS — M722 Plantar fascial fibromatosis: Secondary | ICD-10-CM | POA: Diagnosis not present

## 2016-10-25 DIAGNOSIS — M2011 Hallux valgus (acquired), right foot: Secondary | ICD-10-CM | POA: Diagnosis not present

## 2016-12-28 ENCOUNTER — Emergency Department
Admission: EM | Admit: 2016-12-28 | Discharge: 2016-12-28 | Disposition: A | Discharge status: Home / Self Care | Payer: BLUE CROSS/BLUE SHIELD | Source: Home / Self Care | Attending: Family Medicine | Admitting: Family Medicine

## 2016-12-28 DIAGNOSIS — H9202 Otalgia, left ear: Secondary | ICD-10-CM

## 2016-12-28 DIAGNOSIS — J069 Acute upper respiratory infection, unspecified: Secondary | ICD-10-CM

## 2016-12-28 MED ORDER — AMOXICILLIN-POT CLAVULANATE 875-125 MG PO TABS: 1.0000 | ORAL_TABLET | Freq: Two times a day (BID) | ORAL | 0 refills | Status: DC

## 2016-12-28 MED ORDER — GUAIFENESIN-CODEINE 100-10 MG/5ML PO SYRP: 5.0000 mL | ORAL_SOLUTION | Freq: Three times a day (TID) | ORAL | 0 refills | Status: DC | PRN

## 2016-12-28 MED ORDER — IPRATROPIUM BROMIDE 0.06 % NA SOLN: 2.0000 | Freq: Four times a day (QID) | NASAL | 1 refills | Status: DC

## 2016-12-28 NOTE — ED Triage Notes (Signed)
Monday had fever, chills, body ache, and headache.  Wednesday night had coughing and congestion.  Has been unable to sleep at night due to coughing.

## 2016-12-28 NOTE — ED Provider Notes (Signed)
CSN: [id number]     Arrival date & time 12/28/16  1722 History   First MD Initiated Contact with Patient 12/28/16 1739     Chief Complaint  Patient presents with  . Cough  . Otalgia    left  . Nasal Congestion   (Consider location/radiation/quality/duration/timing/severity/associated sxs/prior Treatment) HPI Harry Lewis is a 51 y.o. male presenting to UC with c/o 5 days of URI symptoms that initially started with body aches and chills, which has since resolved, followed by headache, cough, congestion, and now ear pain. Cough is most bothersome for pt, keeping him up at night even when propping himself up to sleep.  Denies known fever. Denies n/v/d. Denies chest pain or SOB. Hx of asthma but he has not needed to use his inhaler recently.   Past Medical History:  Diagnosis Date  . Arthritis   . Asthma    Past Surgical History:  Procedure Laterality Date  . NO PAST SURGERIES     Family History  Problem Relation Age of Onset  . Diabetes Mother   . Stroke Mother   . Throat cancer Father     smoker   Social History  Substance Use Topics  . Smoking status: Former Smoker    Quit date: 10/07/1988  . Smokeless tobacco: Never Used  . Alcohol use 3.6 oz/week    6 Cans of beer per week     Comment: 5/week    Review of Systems  Constitutional: Positive for chills. Negative for fever.  HENT: Positive for congestion, ear pain and postnasal drip. Negative for sore throat, trouble swallowing and voice change.   Respiratory: Positive for cough. Negative for shortness of breath.   Cardiovascular: Negative for chest pain and palpitations.  Gastrointestinal: Negative for abdominal pain, diarrhea, nausea and vomiting.  Musculoskeletal: Positive for arthralgias and myalgias. Negative for back pain.  Skin: Negative for rash.    Allergies  Patient has no known allergies.  Home Medications   Prior to Admission medications   Medication Sig Start Date End Date Taking? Authorizing  Provider  amoxicillin-clavulanate (AUGMENTIN) 875-125 MG tablet Take 1 tablet by mouth 2 (two) times daily. One po bid x 7 days 12/28/16   Noland Fordyce, PA-C  azithromycin (ZITHROMAX Z-PAK) 250 MG tablet Take 2 tabs today; then begin one tab once daily for 4 more days. 09/14/16   Kandra Nicolas, MD  benzonatate (TESSALON) 200 MG capsule Take 1 capsule (200 mg total) by mouth 3 (three) times daily as needed for cough. 09/14/16   Kandra Nicolas, MD  guaiFENesin-codeine (VIRTUSSIN A/C) 100-10 MG/5ML syrup Take 5 mLs by mouth 3 (three) times daily as needed for cough or congestion. 12/28/16   Noland Fordyce, PA-C  ipratropium (ATROVENT) 0.06 % nasal spray Place 2 sprays into both nostrils 4 (four) times daily. 12/28/16   Noland Fordyce, PA-C  levofloxacin (LEVAQUIN) 750 MG tablet Take 1 tablet (750 mg total) by mouth daily. 09/16/16   Kandra Nicolas, MD  predniSONE (DELTASONE) 20 MG tablet Take one tab by mouth twice daily for 5 days, then one daily for 3 days. Take with food. 09/14/16   Kandra Nicolas, MD  PROAIR HFA 108 [phone] Base) MCG/ACT inhaler 2 PUFF EVERY 4 HR AS NEEDED 09/12/16   Hali Marry, MD   Meds Ordered and Administered this Visit  Medications - No data to display  BP 162/91 (BP Location: Left Arm)   Pulse 77   Temp 98 F (36.7 C) (Oral)  Ht 5\' 11"  (1.803 m)   Wt 198 lb (89.8 kg)   SpO2 97%   BMI 27.62 kg/m  No data found.   Physical Exam  Constitutional: He is oriented to person, place, and time. He appears well-developed and well-nourished. No distress.  HENT:  Head: Normocephalic and atraumatic.  Right Ear: Tympanic membrane normal.  Left Ear: Tympanic membrane normal.  Nose: Nose normal.  Mouth/Throat: Uvula is midline, oropharynx is clear and moist and mucous membranes are normal.  Eyes: EOM are normal.  Neck: Normal range of motion. Neck supple.  Cardiovascular: Normal rate and regular rhythm.   Pulmonary/Chest: Effort normal and breath sounds normal. No  stridor. No respiratory distress. He has no wheezes. He has no rales.  Musculoskeletal: Normal range of motion.  Lymphadenopathy:    He has no cervical adenopathy.  Neurological: He is alert and oriented to person, place, and time.  Skin: Skin is warm and dry. He is not diaphoretic.  Psychiatric: He has a normal mood and affect. His behavior is normal.  Nursing note and vitals reviewed.   Urgent Care Course     Procedures (including critical care time)  Labs Review Labs Reviewed - No data to display  Imaging Review No results found.   MDM   1. Upper respiratory tract infection, unspecified type   2. Left ear pain    Pt c/o worsening cough for 1 week. Left ear pain. Hx of asthma. O2 Sat 97% on RA  Rx: augmentin, virtussin, and ipratropium nasal spray  F/u with PCP in 1 week if not improving.    Noland Fordyce, PA-C 12/28/16 1920

## 2016-12-28 NOTE — Discharge Instructions (Signed)
°  Virtussin (guaifenesin-codeine) is a strong narcotic cough medication.  Only take up to 3 times daily as needed for severe cough.  Be sure to take with large glass of water.  It may cause drowsiness. Do not drive, drink alcohol or take other sedating medications such as Nyquil while taking this medication.  ° °

## 2018-01-21 IMAGING — DX DG RIBS W/ CHEST 3+V*L*
3 series · 3 of 3 positions shown · non-contrast
Comparison: None in PACs

CLINICAL DATA: Began coughing 5 days ago and had onset of left
ribcage pain last night. Former smoker. History of asthma.

EXAM:
LEFT RIBS AND CHEST - 3+ VIEW

[chest pa]
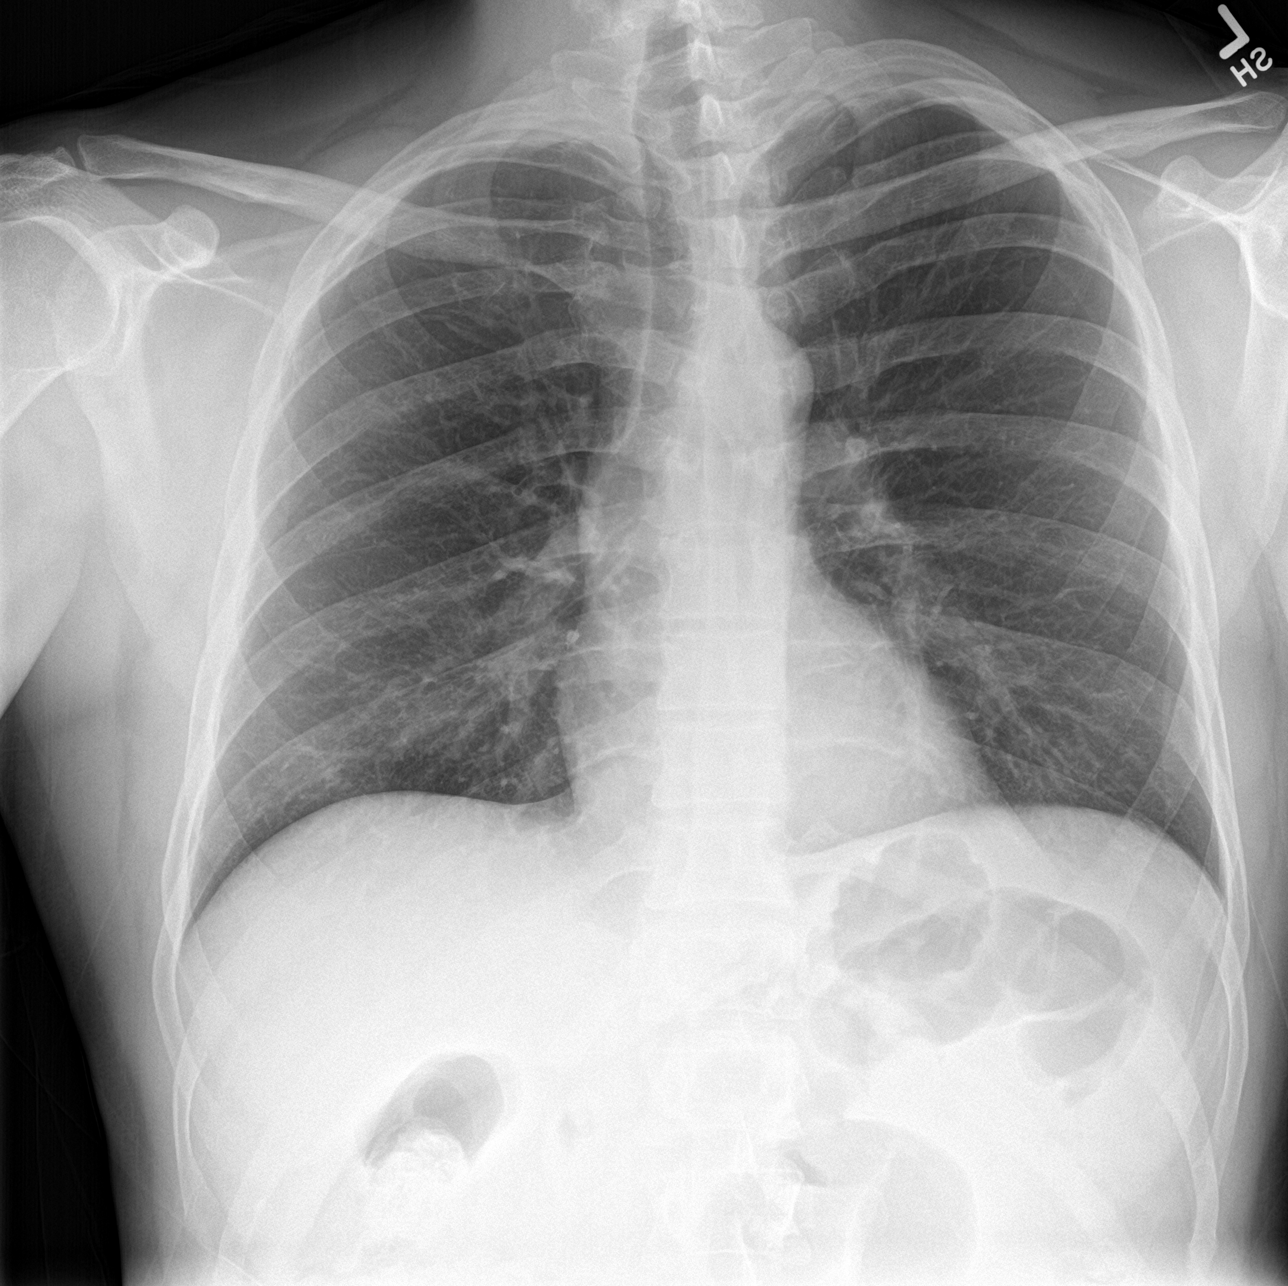

[rib ap]
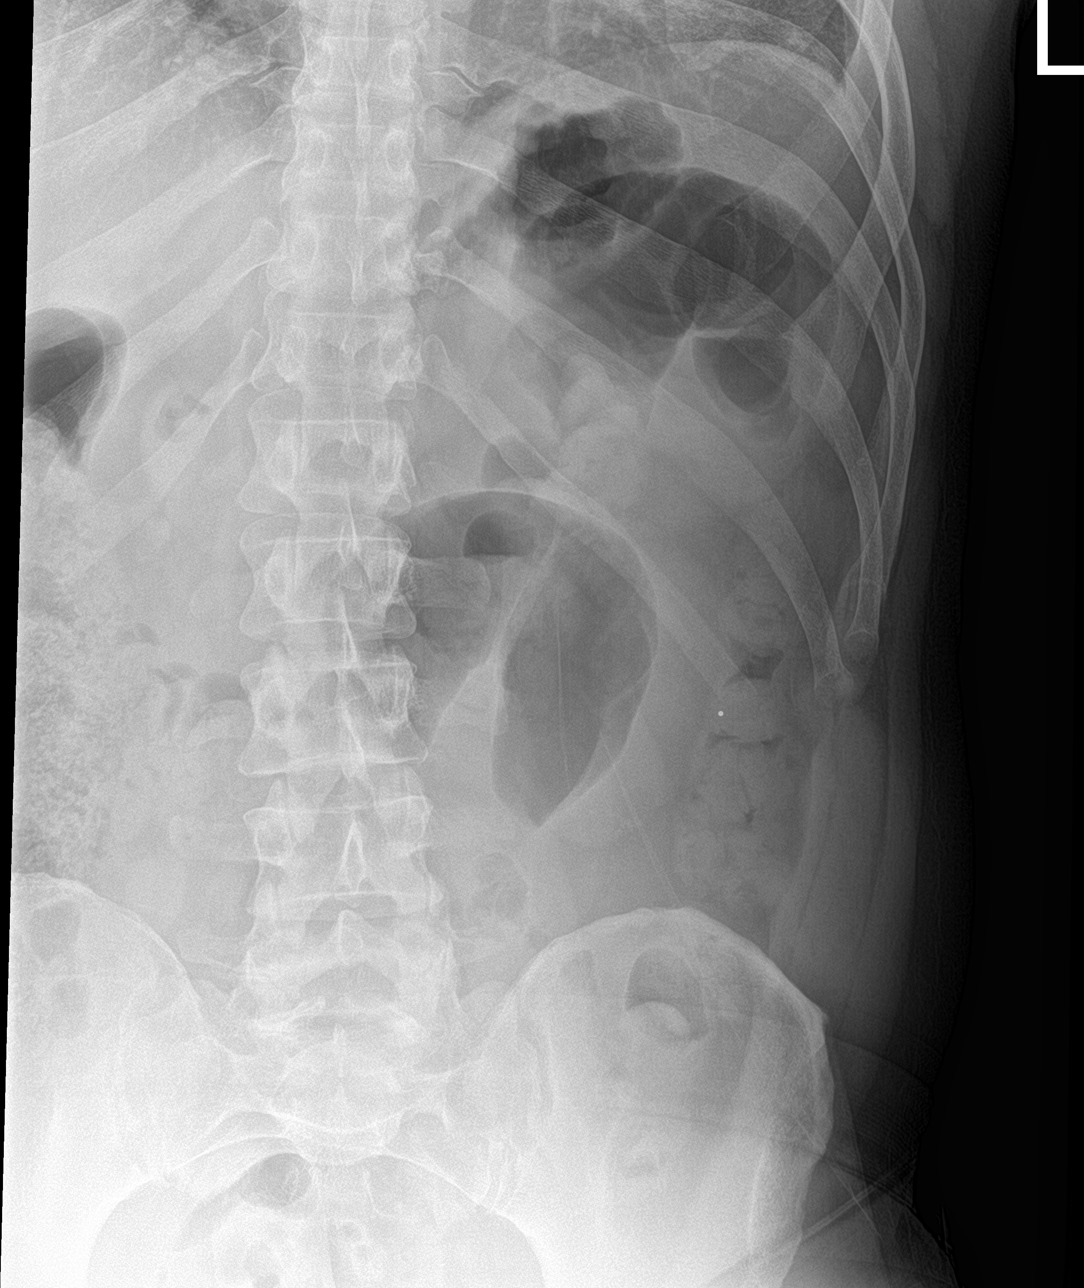

[rib ap obl]
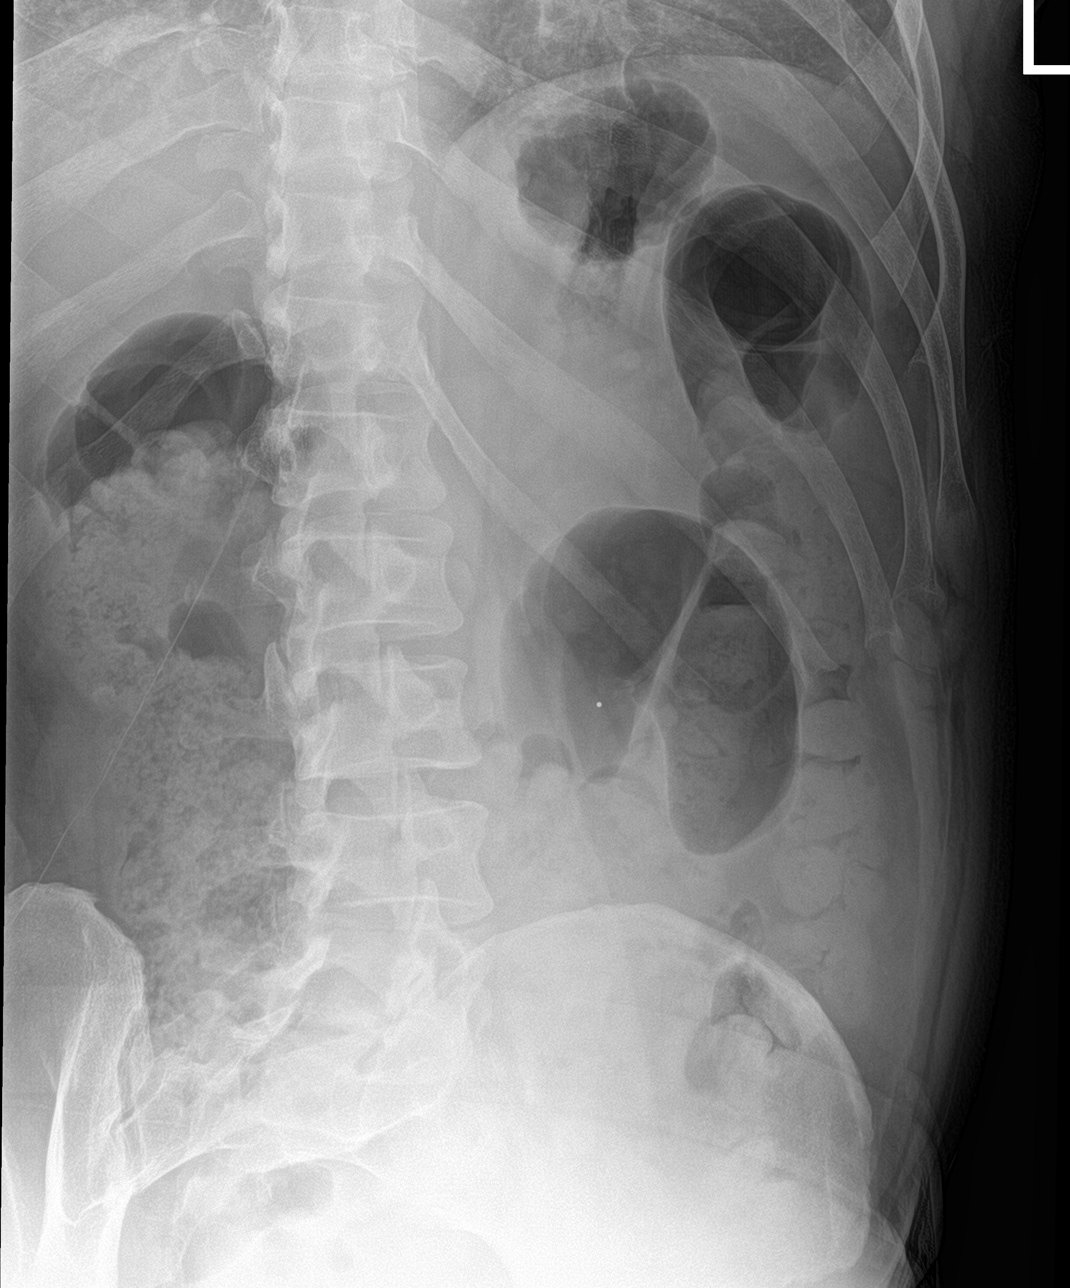

[3 of 3 positions shown; findings below may reference images not displayed]

FINDINGS: The lungs are adequately inflated. There is no focal infiltrate.
There is no pleural effusion or pneumothorax or pneumomediastinum.
The heart and pulmonary vascularity are normal.

Left rib detail images reveal no acute displaced or nondisplaced
lower rib fractures.
IMPRESSION: There is no active cardiopulmonary disease. No acute rib fracture is
observed.

## 2018-05-01 ENCOUNTER — Encounter: Payer: Self-pay | Admitting: Family Medicine

## 2018-06-26 ENCOUNTER — Encounter: Payer: Self-pay | Admitting: Family Medicine

## 2018-06-26 ENCOUNTER — Ambulatory Visit (INDEPENDENT_AMBULATORY_CARE_PROVIDER_SITE_OTHER): Payer: BLUE CROSS/BLUE SHIELD | Admitting: Family Medicine

## 2018-06-26 VITALS — BP 132/77 | HR 59 | Ht 71.0 in | Wt 192.0 lb

## 2018-06-26 DIAGNOSIS — Z0000 Encounter for general adult medical examination without abnormal findings: Secondary | ICD-10-CM | POA: Diagnosis not present

## 2018-06-26 DIAGNOSIS — Z23 Encounter for immunization: Secondary | ICD-10-CM

## 2018-06-26 DIAGNOSIS — Z1211 Encounter for screening for malignant neoplasm of colon: Secondary | ICD-10-CM

## 2018-06-26 MED ORDER — ALBUTEROL SULFATE 108 (90 BASE) MCG/ACT IN AEPB: 2.0000 | INHALATION_SPRAY | RESPIRATORY_TRACT | 1 refills | Status: DC | PRN

## 2018-06-26 NOTE — Progress Notes (Signed)
Subjective:    Patient ID: Harry Lewis, male    DOB: 1966-06-22, 52 y.o.   MRN: [id number]  HPI 52 year old male is here today for complete physical exam.   Review of Systems Comprehensive review of systems is negative except for HPI.  BP 132/77   Pulse (!) 59   Ht 5\' 11"  (1.803 m)   Wt 192 lb (87.1 kg)   SpO2 100%   BMI 26.78 kg/m     No Known Allergies  Past Medical History:  Diagnosis Date  . Arthritis   . Asthma     Past Surgical History:  Procedure Laterality Date  . NO PAST SURGERIES      Social History   Socioeconomic History  . Marital status: Married    Spouse name: Not on file  . Number of children: 0  . Years of education: Not on file  . Highest education level: Not on file  Occupational History  . Occupation: English as a second language teacher   Social Needs  . Financial resource strain: Not on file  . Food insecurity:    Worry: Not on file    Inability: Not on file  . Transportation needs:    Medical: Not on file    Non-medical: Not on file  Tobacco Use  . Smoking status: Former Smoker    Last attempt to quit: 10/07/1988    Years since quitting: 29.7  . Smokeless tobacco: Never Used  Substance and Sexual Activity  . Alcohol use: Yes    Alcohol/week: 6.0 standard drinks    Types: 6 Cans of beer per week    Comment: 5/week  . Drug use: No  . Sexual activity: Yes    Partners: Female  Lifestyle  . Physical activity:    Days per week: Not on file    Minutes per session: Not on file  . Stress: Not on file  Relationships  . Social connections:    Talks on phone: Not on file    Gets together: Not on file    Attends religious service: Not on file    Active member of club or organization: Not on file    Attends meetings of clubs or organizations: Not on file    Relationship status: Not on file  . Intimate partner violence:    Fear of current or ex partner: Not on file    Emotionally abused: Not on file    Physically abused: Not on file    Forced sexual  activity: Not on file  Other Topics Concern  . Not on file  Social History Narrative   Occ execise , mostly on the weekend.     Family History  Problem Relation Age of Onset  . Diabetes Mother   . Stroke Mother   . Throat cancer Father        smoker    Outpatient Encounter Medications as of 06/26/2018  Medication Sig  . [DISCONTINUED] PROAIR HFA 108 (90 Base) MCG/ACT inhaler 2 PUFF EVERY 4 HR AS NEEDED  . Albuterol Sulfate (PROAIR RESPICLICK) 789 (90 Base) MCG/ACT AEPB Inhale 2 puffs into the lungs every 4 (four) hours as needed.  . [DISCONTINUED] amoxicillin-clavulanate (AUGMENTIN) 875-125 MG tablet Take 1 tablet by mouth 2 (two) times daily. One po bid x 7 days  . [DISCONTINUED] azithromycin (ZITHROMAX Z-PAK) 250 MG tablet Take 2 tabs today; then begin one tab once daily for 4 more days.  . [DISCONTINUED] benzonatate (TESSALON) 200 MG capsule Take 1 capsule (200 mg total) by  mouth 3 (three) times daily as needed for cough.  . [DISCONTINUED] guaiFENesin-codeine (VIRTUSSIN A/C) 100-10 MG/5ML syrup Take 5 mLs by mouth 3 (three) times daily as needed for cough or congestion.  . [DISCONTINUED] ipratropium (ATROVENT) 0.06 % nasal spray Place 2 sprays into both nostrils 4 (four) times daily.  . [DISCONTINUED] levofloxacin (LEVAQUIN) 750 MG tablet Take 1 tablet (750 mg total) by mouth daily.  . [DISCONTINUED] predniSONE (DELTASONE) 20 MG tablet Take one tab by mouth twice daily for 5 days, then one daily for 3 days. Take with food.   No facility-administered encounter medications on file as of 06/26/2018.          Objective:   Physical Exam  Constitutional: He is oriented to person, place, and time. He appears well-developed and well-nourished.  HENT:  Head: Normocephalic and atraumatic.  Right Ear: External ear normal.  Left Ear: External ear normal.  Nose: Nose normal.  Mouth/Throat: Oropharynx is clear and moist.  Eyes: Pupils are equal, round, and reactive to light. Conjunctivae  and EOM are normal.  Neck: Normal range of motion. Neck supple. No thyromegaly present.  Cardiovascular: Normal rate, regular rhythm, normal heart sounds and intact distal pulses.  Pulmonary/Chest: Effort normal and breath sounds normal.  Abdominal: Soft. Bowel sounds are normal. He exhibits no distension and no mass. There is no tenderness. There is no rebound and no guarding.  Musculoskeletal: Normal range of motion.  Lymphadenopathy:    He has no cervical adenopathy.  Neurological: He is alert and oriented to person, place, and time. He has normal reflexes.  Skin: Skin is warm and dry.  Psychiatric: He has a normal mood and affect. His behavior is normal. Judgment and thought content normal.        Assessment & Plan:  CPE Keep up a regular exercise program and make sure you are eating a healthy diet Try to eat 4 servings of dairy a day, or if you are lactose intolerant take a calcium with vitamin D daily.  Your vaccines are up to date.  He is okay with getting the new shingles vaccine so we will add him to the list and call him when we get in more supply.  He is currently on back order. Flu vaccine given today. Due for screening labs today.

## 2018-06-26 NOTE — Patient Instructions (Signed)

## 2018-06-28 LAB — HEMOGLOBIN A1C W/OUT EAG: Hgb A1c MFr Bld: 5.3 % of total Hgb (ref <5.7)

## 2018-06-28 LAB — COMPLETE METABOLIC PANEL WITH GFR
AG Ratio: 1.7 (calc) (ref 1.0–2.5)
ALBUMIN MSPROF: 4.3 g/dL (ref 3.6–5.1)
ALKALINE PHOSPHATASE (APISO): 64 U/L (ref 40–115)
ALT: 20 U/L (ref 9–46)
AST: 20 U/L (ref 10–35)
BUN: 16 mg/dL (ref 7–25)
CO2: 28 mmol/L (ref 20–32)
Calcium: 9.5 mg/dL (ref 8.6–10.3)
Chloride: 105 mmol/L (ref 98–110)
Creat: 1.3 mg/dL (ref 0.70–1.33)
GFR, EST NON AFRICAN AMERICAN: 63 mL/min/{1.73_m2} (ref >=60)
GFR, Est African American: 73 mL/min/{1.73_m2} (ref >=60)
GLOBULIN: 2.5 g/dL (ref 1.9–3.7)
Glucose, Bld: 103 mg/dL — ABNORMAL HIGH (ref 65–99)
Potassium: 4.8 mmol/L (ref 3.5–5.3)
SODIUM: 139 mmol/L (ref 135–146)
Total Bilirubin: 0.5 mg/dL (ref 0.2–1.2)
Total Protein: 6.8 g/dL (ref 6.1–8.1)

## 2018-06-28 LAB — LIPID PANEL
CHOL/HDL RATIO: 3.7 (calc) (ref <5.0)
Cholesterol: 226 mg/dL — ABNORMAL HIGH (ref <200)
HDL: 61 mg/dL (ref >40)
LDL Cholesterol (Calc): 145 mg/dL (calc) — ABNORMAL HIGH
NON-HDL CHOLESTEROL (CALC): 165 mg/dL — AB (ref <130)
Triglycerides: 95 mg/dL (ref <150)

## 2018-06-28 LAB — CBC
HCT: 40.3 % (ref 38.5–50.0)
HEMOGLOBIN: 13.7 g/dL (ref 13.2–17.1)
MCH: 30.2 pg (ref 27.0–33.0)
MCHC: 34 g/dL (ref 32.0–36.0)
MCV: 89 fL (ref 80.0–100.0)
MPV: 9 fL (ref 7.5–12.5)
PLATELETS: 247 10*3/uL (ref 140–400)
RBC: 4.53 10*6/uL (ref 4.20–5.80)
RDW: 13.1 % (ref 11.0–15.0)
WBC: 4.9 10*3/uL (ref 3.8–10.8)

## 2018-06-28 LAB — PSA: PSA: 0.4 ng/mL (ref <=4.0)

## 2018-07-01 ENCOUNTER — Encounter: Payer: Self-pay | Admitting: Gastroenterology

## 2018-07-01 ENCOUNTER — Telehealth: Payer: Self-pay | Admitting: *Deleted

## 2018-07-01 NOTE — Telephone Encounter (Signed)
Called pt and asked him about the pressure he needed for his compression socks. He stated that he did not know. He would contact the representative and call back with this information. Marland KitchenMaryruth Eve, Lahoma Crocker, CMA

## 2018-08-18 ENCOUNTER — Ambulatory Visit (AMBULATORY_SURGERY_CENTER): Payer: Self-pay | Admitting: *Deleted

## 2018-08-18 ENCOUNTER — Other Ambulatory Visit: Payer: Self-pay

## 2018-08-18 VITALS — Ht 71.0 in | Wt 195.0 lb

## 2018-08-18 DIAGNOSIS — Z1211 Encounter for screening for malignant neoplasm of colon: Secondary | ICD-10-CM

## 2018-08-18 MED ORDER — PEG 3350-KCL-NA BICARB-NACL 420 G PO SOLR: 4000.0000 mL | Freq: Once | ORAL | 0 refills | Status: AC

## 2018-08-18 NOTE — Progress Notes (Signed)
No egg or soy allergy known to patient  No issues with past sedation with any surgeries  or procedures, no intubation problems  No diet pills per patient No home 02 use per patient  No blood thinners per patient  Pt denies issues with constipation  No A fib or A flutter  EMMI video sent to pt's e mail  

## 2018-08-19 ENCOUNTER — Encounter: Payer: Self-pay | Admitting: Gastroenterology

## 2018-09-01 ENCOUNTER — Ambulatory Visit (AMBULATORY_SURGERY_CENTER): Payer: BLUE CROSS/BLUE SHIELD | Admitting: Gastroenterology

## 2018-09-01 ENCOUNTER — Encounter: Payer: Self-pay | Admitting: Gastroenterology

## 2018-09-01 VITALS — BP 95/59 | HR 60 | Temp 97.8°F | Resp 16 | Ht 71.0 in | Wt 195.0 lb

## 2018-09-01 DIAGNOSIS — Z1211 Encounter for screening for malignant neoplasm of colon: Secondary | ICD-10-CM | POA: Diagnosis not present

## 2018-09-01 DIAGNOSIS — D124 Benign neoplasm of descending colon: Secondary | ICD-10-CM | POA: Diagnosis not present

## 2018-09-01 DIAGNOSIS — D128 Benign neoplasm of rectum: Secondary | ICD-10-CM

## 2018-09-01 MED ORDER — SODIUM CHLORIDE 0.9 % IV SOLN: 500.0000 mL | Freq: Once | INTRAVENOUS | Status: DC

## 2018-09-01 NOTE — Progress Notes (Signed)
Called to room to assist during endoscopic procedure.  Patient ID and intended procedure confirmed with present staff. Received instructions for my participation in the procedure from the performing physician.  

## 2018-09-01 NOTE — Patient Instructions (Signed)
Handout given on polyps. 2 removed today. Wait for pathology letter in mail.   YOU HAD AN ENDOSCOPIC PROCEDURE TODAY AT [facility]:   Refer to the procedure report that was given to you for any specific questions about what was found during the examination.  If the procedure report does not answer your questions, please call your gastroenterologist to clarify.  If you requested that your care partner not be given the details of your procedure findings, then the procedure report has been included in a sealed envelope for you to review at your convenience later.  YOU SHOULD EXPECT: Some feelings of bloating in the abdomen. Passage of more gas than usual.  Walking can help get rid of the air that was put into your GI tract during the procedure and reduce the bloating. If you had a lower endoscopy (such as a colonoscopy or flexible sigmoidoscopy) you may notice spotting of blood in your stool or on the toilet paper. If you underwent a bowel prep for your procedure, you may not have a normal bowel movement for a few days.  Please Note:  You might notice some irritation and congestion in your nose or some drainage.  This is from the oxygen used during your procedure.  There is no need for concern and it should clear up in a day or so.  SYMPTOMS TO REPORT IMMEDIATELY:   Following lower endoscopy (colonoscopy or flexible sigmoidoscopy):  Excessive amounts of blood in the stool  Significant tenderness or worsening of abdominal pains  Swelling of the abdomen that is new, acute  Fever of 100F or higher   For urgent or emergent issues, a gastroenterologist can be reached at any hour by calling [phone].   DIET:  We do recommend a small meal at first, but then you may proceed to your regular diet.  Drink plenty of fluids but you should avoid alcoholic beverages for 24 hours.  ACTIVITY:  You should plan to take it easy for the rest of today and you should NOT DRIVE or use heavy  machinery until tomorrow (because of the sedation medicines used during the test).    FOLLOW UP: Our staff will call the number listed on your records the next business day following your procedure to check on you and address any questions or concerns that you may have regarding the information given to you following your procedure. If we do not reach you, we will leave a message.  However, if you are feeling well and you are not experiencing any problems, there is no need to return our call.  We will assume that you have returned to your regular daily activities without incident.  If any biopsies were taken you will be contacted by phone or by letter within the next 1-3 weeks.  Please call us at [phone] if you have not heard about the biopsies in 3 weeks.    SIGNATURES/CONFIDENTIALITY: You and/or your care partner have signed paperwork which will be entered into your electronic medical record.  These signatures attest to the fact that that the information above on your After Visit Summary has been reviewed and is understood.  Full responsibility of the confidentiality of this discharge information lies with you and/or your care-partner.

## 2018-09-01 NOTE — Progress Notes (Signed)
Report given to PACU, vss 

## 2018-09-01 NOTE — Op Note (Signed)
[facility] Patient Name: Harry Lewis Procedure Date: 09/01/2018 9:39 AM MRN: [id number] Endoscopist: Milus Banister , MD Age: 52 Referring MD:  Date of Birth: February 23, 1966 Gender: Male Account #: [id number] Procedure:                Colonoscopy Indications:              Screening for colorectal malignant neoplasm Medicines:                Monitored Anesthesia Care Procedure:                Pre-Anesthesia Assessment:                           - Prior to the procedure, a History and Physical                            was performed, and patient medications and                            allergies were reviewed. The patient's tolerance of                            previous anesthesia was also reviewed. The risks                            and benefits of the procedure and the sedation                            options and risks were discussed with the patient.                            All questions were answered, and informed consent                            was obtained. Prior Anticoagulants: The patient has                            taken no previous anticoagulant or antiplatelet                            agents. ASA Grade Assessment: II - A patient with                            mild systemic disease. After reviewing the risks                            and benefits, the patient was deemed in                            satisfactory condition to undergo the procedure.                           After obtaining informed consent, the colonoscope  was passed under direct vision. Throughout the                            procedure, the patient's blood pressure, pulse, and                            oxygen saturations were monitored continuously. The                            Colonoscope was introduced through the anus and                            advanced to the the cecum, identified by                            appendiceal orifice and  ileocecal valve. The                            colonoscopy was performed without difficulty. The                            patient tolerated the procedure well. The quality                            of the bowel preparation was good. The ileocecal                            valve, appendiceal orifice, and rectum were                            photographed. Scope In: 9:51:37 AM Scope Out: 10:11:32 AM Scope Withdrawal Time: 0 hours 17 minutes 50 seconds  Total Procedure Duration: 0 hours 19 minutes 55 seconds  Findings:                 A 14 mm polyp was found in the descending colon.                            The polyp was pedunculated. The polyp was removed                            with a hot snare. Resection and retrieval were                            complete.                           A 3 mm polyp was found in the rectum. The polyp was                            sessile. The polyp was removed with a cold snare.                            Resection and retrieval were complete.  The exam was otherwise without abnormality on                            direct and retroflexion views. Complications:            No immediate complications. Estimated blood loss:                            None. Estimated Blood Loss:     Estimated blood loss: none. Impression:               - One 14 mm polyp in the descending colon, removed                            with a hot snare. Resected and retrieved.                           - One 3 mm polyp in the rectum, removed with a cold                            snare. Resected and retrieved.                           - The examination was otherwise normal on direct                            and retroflexion views. Recommendation:           - Patient has a contact number available for                            emergencies. The signs and symptoms of potential                            delayed complications were discussed with  the                            patient. Return to normal activities tomorrow.                            Written discharge instructions were provided to the                            patient.                           - Resume previous diet.                           - Continue present medications.                           You will receive a letter within 2-3 weeks with the                            pathology results and my final recommendations.  If the polyp(s) is proven to be 'pre-cancerous' on                            pathology, you will need repeat colonoscopy in 3-5                            years. If the polyp(s) is NOT 'precancerous' on                            pathology then you should repeat colon cancer                            screening in 10 years with colonoscopy without need                            for colon cancer screening by any method prior to                            then (including stool testing). Milus Banister, MD 09/01/2018 10:17:08 AM This report has been signed electronically.

## 2018-09-02 ENCOUNTER — Telehealth: Payer: Self-pay | Admitting: *Deleted

## 2018-09-02 ENCOUNTER — Telehealth: Payer: Self-pay

## 2018-09-02 NOTE — Telephone Encounter (Signed)
No answer for post procedure call back. Left message and will to attempt to call back later this afternoon. SM

## 2018-09-02 NOTE — Telephone Encounter (Signed)
  Follow up Call-  Call back number 09/01/2018  Post procedure Call Back phone  # [phone]  Permission to leave phone message Yes  Some recent data might be hidden     Patient questions:  Do you have a fever, pain , or abdominal swelling? No. Pain Score  0 *  Have you tolerated food without any problems? Yes.    Have you been able to return to your normal activities? Yes.    Do you have any questions about your discharge instructions: Diet   No. Medications  No. Follow up visit  No.  Do you have questions or concerns about your Care? No.  Actions: * If pain score is 4 or above: No action needed, pain <4.

## 2018-09-08 ENCOUNTER — Encounter: Payer: Self-pay | Admitting: Gastroenterology

## 2020-07-22 DIAGNOSIS — M9903 Segmental and somatic dysfunction of lumbar region: Secondary | ICD-10-CM | POA: Diagnosis not present

## 2020-07-22 DIAGNOSIS — M9901 Segmental and somatic dysfunction of cervical region: Secondary | ICD-10-CM | POA: Diagnosis not present

## 2020-07-22 DIAGNOSIS — M542 Cervicalgia: Secondary | ICD-10-CM | POA: Diagnosis not present

## 2020-07-22 DIAGNOSIS — M545 Low back pain: Secondary | ICD-10-CM | POA: Diagnosis not present

## 2020-07-25 DIAGNOSIS — M545 Low back pain: Secondary | ICD-10-CM | POA: Diagnosis not present

## 2020-07-25 DIAGNOSIS — M9901 Segmental and somatic dysfunction of cervical region: Secondary | ICD-10-CM | POA: Diagnosis not present

## 2020-07-25 DIAGNOSIS — M9903 Segmental and somatic dysfunction of lumbar region: Secondary | ICD-10-CM | POA: Diagnosis not present

## 2020-07-25 DIAGNOSIS — M542 Cervicalgia: Secondary | ICD-10-CM | POA: Diagnosis not present

## 2020-07-27 DIAGNOSIS — M9901 Segmental and somatic dysfunction of cervical region: Secondary | ICD-10-CM | POA: Diagnosis not present

## 2020-07-27 DIAGNOSIS — M542 Cervicalgia: Secondary | ICD-10-CM | POA: Diagnosis not present

## 2020-07-27 DIAGNOSIS — M545 Low back pain: Secondary | ICD-10-CM | POA: Diagnosis not present

## 2020-07-27 DIAGNOSIS — M9903 Segmental and somatic dysfunction of lumbar region: Secondary | ICD-10-CM | POA: Diagnosis not present

## 2020-07-30 DIAGNOSIS — M542 Cervicalgia: Secondary | ICD-10-CM | POA: Diagnosis not present

## 2020-07-30 DIAGNOSIS — M9901 Segmental and somatic dysfunction of cervical region: Secondary | ICD-10-CM | POA: Diagnosis not present

## 2020-07-30 DIAGNOSIS — M545 Low back pain: Secondary | ICD-10-CM | POA: Diagnosis not present

## 2020-07-30 DIAGNOSIS — M9903 Segmental and somatic dysfunction of lumbar region: Secondary | ICD-10-CM | POA: Diagnosis not present

## 2020-08-01 DIAGNOSIS — M542 Cervicalgia: Secondary | ICD-10-CM | POA: Diagnosis not present

## 2020-08-01 DIAGNOSIS — M545 Low back pain: Secondary | ICD-10-CM | POA: Diagnosis not present

## 2020-08-01 DIAGNOSIS — M9901 Segmental and somatic dysfunction of cervical region: Secondary | ICD-10-CM | POA: Diagnosis not present

## 2020-08-01 DIAGNOSIS — M9903 Segmental and somatic dysfunction of lumbar region: Secondary | ICD-10-CM | POA: Diagnosis not present

## 2020-08-06 DIAGNOSIS — M9901 Segmental and somatic dysfunction of cervical region: Secondary | ICD-10-CM | POA: Diagnosis not present

## 2020-08-06 DIAGNOSIS — M5451 Vertebrogenic low back pain: Secondary | ICD-10-CM | POA: Diagnosis not present

## 2020-08-06 DIAGNOSIS — M9903 Segmental and somatic dysfunction of lumbar region: Secondary | ICD-10-CM | POA: Diagnosis not present

## 2020-08-06 DIAGNOSIS — M542 Cervicalgia: Secondary | ICD-10-CM | POA: Diagnosis not present

## 2020-08-10 DIAGNOSIS — M9903 Segmental and somatic dysfunction of lumbar region: Secondary | ICD-10-CM | POA: Diagnosis not present

## 2020-08-10 DIAGNOSIS — M5451 Vertebrogenic low back pain: Secondary | ICD-10-CM | POA: Diagnosis not present

## 2020-08-10 DIAGNOSIS — M9901 Segmental and somatic dysfunction of cervical region: Secondary | ICD-10-CM | POA: Diagnosis not present

## 2020-08-10 DIAGNOSIS — M542 Cervicalgia: Secondary | ICD-10-CM | POA: Diagnosis not present

## 2020-08-13 DIAGNOSIS — M9903 Segmental and somatic dysfunction of lumbar region: Secondary | ICD-10-CM | POA: Diagnosis not present

## 2020-08-13 DIAGNOSIS — M542 Cervicalgia: Secondary | ICD-10-CM | POA: Diagnosis not present

## 2020-08-13 DIAGNOSIS — M9901 Segmental and somatic dysfunction of cervical region: Secondary | ICD-10-CM | POA: Diagnosis not present

## 2020-08-13 DIAGNOSIS — M5451 Vertebrogenic low back pain: Secondary | ICD-10-CM | POA: Diagnosis not present

## 2020-08-17 DIAGNOSIS — M542 Cervicalgia: Secondary | ICD-10-CM | POA: Diagnosis not present

## 2020-08-17 DIAGNOSIS — M9903 Segmental and somatic dysfunction of lumbar region: Secondary | ICD-10-CM | POA: Diagnosis not present

## 2020-08-17 DIAGNOSIS — M5451 Vertebrogenic low back pain: Secondary | ICD-10-CM | POA: Diagnosis not present

## 2020-08-17 DIAGNOSIS — M9901 Segmental and somatic dysfunction of cervical region: Secondary | ICD-10-CM | POA: Diagnosis not present

## 2020-08-26 DIAGNOSIS — M542 Cervicalgia: Secondary | ICD-10-CM | POA: Diagnosis not present

## 2020-08-26 DIAGNOSIS — M5451 Vertebrogenic low back pain: Secondary | ICD-10-CM | POA: Diagnosis not present

## 2020-08-26 DIAGNOSIS — M9903 Segmental and somatic dysfunction of lumbar region: Secondary | ICD-10-CM | POA: Diagnosis not present

## 2020-08-26 DIAGNOSIS — M9901 Segmental and somatic dysfunction of cervical region: Secondary | ICD-10-CM | POA: Diagnosis not present

## 2020-09-03 DIAGNOSIS — M9901 Segmental and somatic dysfunction of cervical region: Secondary | ICD-10-CM | POA: Diagnosis not present

## 2020-09-03 DIAGNOSIS — M9903 Segmental and somatic dysfunction of lumbar region: Secondary | ICD-10-CM | POA: Diagnosis not present

## 2020-09-03 DIAGNOSIS — M5451 Vertebrogenic low back pain: Secondary | ICD-10-CM | POA: Diagnosis not present

## 2020-09-03 DIAGNOSIS — M542 Cervicalgia: Secondary | ICD-10-CM | POA: Diagnosis not present

## 2020-09-10 DIAGNOSIS — M5451 Vertebrogenic low back pain: Secondary | ICD-10-CM | POA: Diagnosis not present

## 2020-09-10 DIAGNOSIS — M542 Cervicalgia: Secondary | ICD-10-CM | POA: Diagnosis not present

## 2020-09-10 DIAGNOSIS — M9901 Segmental and somatic dysfunction of cervical region: Secondary | ICD-10-CM | POA: Diagnosis not present

## 2020-09-10 DIAGNOSIS — M9903 Segmental and somatic dysfunction of lumbar region: Secondary | ICD-10-CM | POA: Diagnosis not present

## 2021-08-14 ENCOUNTER — Encounter: Payer: Self-pay | Admitting: Gastroenterology

## 2021-09-11 DIAGNOSIS — M25562 Pain in left knee: Secondary | ICD-10-CM | POA: Diagnosis not present

## 2021-10-04 ENCOUNTER — Telehealth: Payer: Self-pay | Admitting: Family Medicine

## 2021-10-04 ENCOUNTER — Other Ambulatory Visit: Payer: Self-pay | Admitting: *Deleted

## 2021-10-04 MED ORDER — ALBUTEROL SULFATE HFA 108 (90 BASE) MCG/ACT IN AERS: 2.0000 | INHALATION_SPRAY | RESPIRATORY_TRACT | 0 refills | Status: DC | PRN

## 2021-10-04 NOTE — Telephone Encounter (Signed)
Pt called and left a voicemail on the scheduling line stating that he has left messages on the assistant's line requesting his rescue inhaler be called in and he has not hear back.

## 2021-10-04 NOTE — Telephone Encounter (Signed)
Pt's med has been sent and was asked to schedule a f/u appt

## 2021-10-19 ENCOUNTER — Other Ambulatory Visit: Payer: Self-pay

## 2021-10-19 ENCOUNTER — Encounter: Payer: Self-pay | Admitting: Family Medicine

## 2021-10-19 ENCOUNTER — Ambulatory Visit (INDEPENDENT_AMBULATORY_CARE_PROVIDER_SITE_OTHER): Payer: BC Managed Care – PPO | Admitting: Family Medicine

## 2021-10-19 VITALS — BP 128/71 | HR 71 | Ht 71.0 in | Wt 201.0 lb

## 2021-10-19 DIAGNOSIS — J4541 Moderate persistent asthma with (acute) exacerbation: Secondary | ICD-10-CM

## 2021-10-19 DIAGNOSIS — R21 Rash and other nonspecific skin eruption: Secondary | ICD-10-CM | POA: Diagnosis not present

## 2021-10-19 MED ORDER — MONTELUKAST SODIUM 10 MG PO TABS: 10.0000 mg | ORAL_TABLET | Freq: Every day | ORAL | 3 refills | Status: DC

## 2021-10-19 MED ORDER — FLUTICASONE-SALMETEROL 250-50 MCG/ACT IN AEPB: 2.0000 | INHALATION_SPRAY | Freq: Two times a day (BID) | RESPIRATORY_TRACT | 3 refills | Status: DC

## 2021-10-19 NOTE — Assessment & Plan Note (Signed)
Discussed controller for 1-2 months and trial of Singulair since there does seem to be an allergic component to his sxs.  Call if not improving.  OK to refill albuterol if needed.

## 2021-10-19 NOTE — Patient Instructions (Signed)
Happy to see your for a physical next year if you would like.

## 2021-10-19 NOTE — Progress Notes (Signed)
Established Patient Office Visit  Subjective:  Patient ID: Harry Lewis, male    DOB: Oct 16, 1966  Age: 55 y.o. MRN: [id number]  CC:  Chief Complaint  Patient presents with   Asthma   Rash    Red itchy area at antecubital area x 2 days. No changes to lotion,soap or detergent. He said that he picked up their christmas tree over the weekend however the rash started on Tuesday.    HPI [name] presents for Asthma.  Also my saw him was about 4 years ago for a physical.  He is here today to reestablish care and to discuss his asthma.  He says a couple of weeks ago he started to have an asthma flare he says its not unusual around this time of year he does not remember any specific trigger.  He just started coughing a lot which is his main symptom.  He did start using his albuterol and was using it about 4 times a day for about a week.  Now he is down to using it maybe once every other day.  He was coughing so much that he lost his voice and it was raspy.  He says he never ran a fever but did feel hot at times.  He says that usually he will have a lot of sinus drainage with it but no watery itchy eyes.  He feels like a lot of it is triggered by mold and mildew this time a year.  He also reports that he does take Zyrtec daily.  He says he has persistent hand itching and so will usually takes Zyrtec daily to relieve those symptoms.  He is also had some knee pain on and off he said he played baseball when he was younger.  He said at couple of weeks ago he said he woke up and it was incredibly painful to bend the knee inward so he ended up going to see an orthopedist.  They did plain films and gave him an anti-inflammatory says within a couple of days the symptoms improved.  He just wanted me to know about it.  Red itchy area at antecubital area x 2 days. No changes to lotion,soap or detergent. He said that he picked up their christmas tree over the weekend however the rash started on  Tuesday  Past Medical History:  Diagnosis Date   Allergy    Arthritis    Asthma    GERD (gastroesophageal reflux disease)     Past Surgical History:  Procedure Laterality Date   NO PAST SURGERIES     wisdom teeth extration      Family History  Problem Relation Age of Onset   Diabetes Mother    Stroke Mother    Throat cancer Father        smoker   Colon cancer Neg Hx    Colon polyps Neg Hx    Esophageal cancer Neg Hx    Rectal cancer Neg Hx    Stomach cancer Neg Hx     Social History   Socioeconomic History   Marital status: Married    Spouse name: Not on file   Number of children: 0   Years of education: Not on file   Highest education level: Not on file  Occupational History   Occupation: English as a second language teacher   Tobacco Use   Smoking status: Former    Types: Cigarettes    Quit date: 10/07/1988    Years since quitting: 33.0   Smokeless  tobacco: Never  Vaping Use   Vaping Use: Never used  Substance and Sexual Activity   Alcohol use: Yes    Alcohol/week: 6.0 standard drinks    Types: 6 Cans of beer per week    Comment: 5/week   Drug use: No   Sexual activity: Yes    Partners: Female  Other Topics Concern   Not on file  Social History Narrative   Occ execise , mostly on the weekend.    Social Determinants of Health   Financial Resource Strain: Not on file  Food Insecurity: Not on file  Transportation Needs: Not on file  Physical Activity: Not on file  Stress: Not on file  Social Connections: Not on file  Intimate Partner Violence: Not on file    Outpatient Medications Prior to Visit  Medication Sig Dispense Refill   albuterol (VENTOLIN HFA) 108 (90 Base) MCG/ACT inhaler Inhale 2 puffs into the lungs every 4 (four) hours as needed. 6.7 g 0   cetirizine (ZYRTEC) 10 MG tablet Take 10 mg by mouth daily.     ibuprofen (ADVIL,MOTRIN) 200 MG tablet Take 200 mg by mouth every 6 (six) hours as needed.     No facility-administered medications prior to visit.    No  Known Allergies  ROS Review of Systems    Objective:    Physical Exam Constitutional:      Appearance: Normal appearance. He is well-developed.  HENT:     Head: Normocephalic and atraumatic.     Right Ear: Tympanic membrane, ear canal and external ear normal.     Left Ear: Tympanic membrane, ear canal and external ear normal.     Nose: Nose normal.     Mouth/Throat:     Mouth: Mucous membranes are moist.     Pharynx: Oropharynx is clear. No posterior oropharyngeal erythema.  Eyes:     Conjunctiva/sclera: Conjunctivae normal.     Pupils: Pupils are equal, round, and reactive to light.  Neck:     Thyroid: No thyromegaly.  Cardiovascular:     Rate and Rhythm: Normal rate and regular rhythm.     Heart sounds: Normal heart sounds.  Pulmonary:     Effort: Pulmonary effort is normal.     Breath sounds: Normal breath sounds.  Musculoskeletal:     Cervical back: Neck supple.  Lymphadenopathy:     Cervical: No cervical adenopathy.  Skin:    General: Skin is warm and dry.  Neurological:     Mental Status: He is alert and oriented to person, place, and time. Mental status is at baseline.  Psychiatric:        Mood and Affect: Mood normal.        Behavior: Behavior normal.    BP 128/71    Pulse 71    Ht 5\' 11"  (1.803 m)    Wt 201 lb (91.2 kg)    SpO2 100%    BMI 28.03 kg/m  Wt Readings from Last 3 Encounters:  10/19/21 201 lb (91.2 kg)  09/01/18 195 lb (88.5 kg)  08/18/18 195 lb (88.5 kg)     Health Maintenance Due  Topic Date Due   COVID-19 Vaccine (1) Never done   Pneumococcal Vaccine 21-40 Years old (1 - PCV) Never done   Hepatitis C Screening  Never done   Zoster Vaccines- Shingrix (1 of 2) Never done   TETANUS/TDAP  11/06/2019   INFLUENZA VACCINE  06/05/2021   COLONOSCOPY (Pts 45-8yrs Insurance coverage will need to be  confirmed)  09/01/2021    There are no preventive care reminders to display for this patient.  No results found for: TSH Lab Results   Component Value Date   WBC 4.9 06/26/2018   HGB 13.7 06/26/2018   HCT 40.3 06/26/2018   MCV 89.0 06/26/2018   PLT 247 06/26/2018   Lab Results  Component Value Date   NA 139 06/26/2018   K 4.8 06/26/2018   CO2 28 06/26/2018   GLUCOSE 103 (H) 06/26/2018   BUN 16 06/26/2018   CREATININE 1.30 06/26/2018   BILITOT 0.5 06/26/2018   ALKPHOS 64 04/05/2015   AST 20 06/26/2018   ALT 20 06/26/2018   PROT 6.8 06/26/2018   ALBUMIN 3.8 04/05/2015   CALCIUM 9.5 06/26/2018   Lab Results  Component Value Date   CHOL 226 (H) 06/26/2018   Lab Results  Component Value Date   HDL 61 06/26/2018   Lab Results  Component Value Date   LDLCALC 145 (H) 06/26/2018   Lab Results  Component Value Date   TRIG 95 06/26/2018   Lab Results  Component Value Date   CHOLHDL 3.7 06/26/2018   Lab Results  Component Value Date   HGBA1C 5.3 06/26/2018      Assessment & Plan:   Problem List Items Addressed This Visit       Respiratory   Moderate persistent asthma with acute exacerbation - Primary    Discussed controller for 1-2 months and trial of Singulair since there does seem to be an allergic component to his sxs.  Call if not improving.  OK to refill albuterol if needed.        Relevant Medications   fluticasone-salmeterol (ADVAIR) 250-50 MCG/ACT AEPB   montelukast (SINGULAIR) 10 MG tablet   Other Visit Diagnoses     Rash          Rash - likely contact dermatitis.  Recommend OTC steroid and if not better in 2 weeks please call.     Meds ordered this encounter  Medications   fluticasone-salmeterol (ADVAIR) 250-50 MCG/ACT AEPB    Sig: Inhale 2 puffs into the lungs in the morning and at bedtime.    Dispense:  60 each    Refill:  3   montelukast (SINGULAIR) 10 MG tablet    Sig: Take 1 tablet (10 mg total) by mouth at bedtime.    Dispense:  30 tablet    Refill:  3    Follow-up: Return in about 1 year (around 10/19/2022).    Beatrice Lecher, MD

## 2021-10-25 ENCOUNTER — Other Ambulatory Visit: Payer: Self-pay

## 2021-10-25 ENCOUNTER — Ambulatory Visit (INDEPENDENT_AMBULATORY_CARE_PROVIDER_SITE_OTHER): Payer: BC Managed Care – PPO | Admitting: Family Medicine

## 2021-10-25 VITALS — Temp 97.8°F

## 2021-10-25 DIAGNOSIS — Z23 Encounter for immunization: Secondary | ICD-10-CM | POA: Diagnosis not present

## 2021-10-25 NOTE — Progress Notes (Signed)
Agree with documentation as above.   Gracen Ringwald, MD  

## 2021-10-25 NOTE — Progress Notes (Signed)
Established Patient Office Visit  Subjective:  Patient ID: Harry Lewis, male    DOB: 08/21/1966  Age: 55 y.o. MRN: [id number]  CC:  Chief Complaint  Patient presents with   Immunizations    HPI CASTER FAYETTE presents for shingles vaccine.   Past Medical History:  Diagnosis Date   Allergy    Arthritis    Asthma    GERD (gastroesophageal reflux disease)     Past Surgical History:  Procedure Laterality Date   NO PAST SURGERIES     wisdom teeth extration      Family History  Problem Relation Age of Onset   Diabetes Mother    Stroke Mother    Throat cancer Father        smoker   Colon cancer Neg Hx    Colon polyps Neg Hx    Esophageal cancer Neg Hx    Rectal cancer Neg Hx    Stomach cancer Neg Hx     Social History   Socioeconomic History   Marital status: Married    Spouse name: Not on file   Number of children: 0   Years of education: Not on file   Highest education level: Not on file  Occupational History   Occupation: English as a second language teacher   Tobacco Use   Smoking status: Former    Types: Cigarettes    Quit date: 10/07/1988    Years since quitting: 33.0   Smokeless tobacco: Never  Vaping Use   Vaping Use: Never used  Substance and Sexual Activity   Alcohol use: Yes    Alcohol/week: 6.0 standard drinks    Types: 6 Cans of beer per week    Comment: 5/week   Drug use: No   Sexual activity: Yes    Partners: Female  Other Topics Concern   Not on file  Social History Narrative   Occ execise , mostly on the weekend.    Social Determinants of Health   Financial Resource Strain: Not on file  Food Insecurity: Not on file  Transportation Needs: Not on file  Physical Activity: Not on file  Stress: Not on file  Social Connections: Not on file  Intimate Partner Violence: Not on file    Outpatient Medications Prior to Visit  Medication Sig Dispense Refill   albuterol (VENTOLIN HFA) 108 (90 Base) MCG/ACT inhaler Inhale 2 puffs into the lungs every 4  (four) hours as needed. 6.7 g 0   cetirizine (ZYRTEC) 10 MG tablet Take 10 mg by mouth daily.     fluticasone-salmeterol (ADVAIR) 250-50 MCG/ACT AEPB Inhale 2 puffs into the lungs in the morning and at bedtime. 60 each 3   ibuprofen (ADVIL,MOTRIN) 200 MG tablet Take 200 mg by mouth every 6 (six) hours as needed.     montelukast (SINGULAIR) 10 MG tablet Take 1 tablet (10 mg total) by mouth at bedtime. 30 tablet 3   No facility-administered medications prior to visit.    No Known Allergies  ROS Review of Systems    Objective:    Physical Exam  Temp 97.8 F (36.6 C) (Temporal)  Wt Readings from Last 3 Encounters:  10/19/21 201 lb (91.2 kg)  09/01/18 195 lb (88.5 kg)  08/18/18 195 lb (88.5 kg)     Health Maintenance Due  Topic Date Due   COVID-19 Vaccine (1) Never done   Pneumococcal Vaccine 67-63 Years old (1 - PCV) Never done   Hepatitis C Screening  Never done   TETANUS/TDAP  11/06/2019  COLONOSCOPY (Pts 45-55yrs Insurance coverage will need to be confirmed)  09/01/2021    There are no preventive care reminders to display for this patient.  No results found for: TSH Lab Results  Component Value Date   WBC 4.9 06/26/2018   HGB 13.7 06/26/2018   HCT 40.3 06/26/2018   MCV 89.0 06/26/2018   PLT 247 06/26/2018   Lab Results  Component Value Date   NA 139 06/26/2018   K 4.8 06/26/2018   CO2 28 06/26/2018   GLUCOSE 103 (H) 06/26/2018   BUN 16 06/26/2018   CREATININE 1.30 06/26/2018   BILITOT 0.5 06/26/2018   ALKPHOS 64 04/05/2015   AST 20 06/26/2018   ALT 20 06/26/2018   PROT 6.8 06/26/2018   ALBUMIN 3.8 04/05/2015   CALCIUM 9.5 06/26/2018   Lab Results  Component Value Date   CHOL 226 (H) 06/26/2018   Lab Results  Component Value Date   HDL 61 06/26/2018   Lab Results  Component Value Date   LDLCALC 145 (H) 06/26/2018   Lab Results  Component Value Date   TRIG 95 06/26/2018   Lab Results  Component Value Date   CHOLHDL 3.7 06/26/2018   Lab  Results  Component Value Date   HGBA1C 5.3 06/26/2018      Assessment & Plan:  Shingles vaccine - Patient tolerated injection well without complications. Patient advised to schedule next injection 2-6 months from today.    Problem List Items Addressed This Visit   None Visit Diagnoses     Need for shingles vaccine    -  Primary   Relevant Orders   Varicella-zoster vaccine IM (Shingrix) (Completed)       No orders of the defined types were placed in this encounter.   Follow-up: Return in about 2 months (around 12/26/2021) for Shingles vaccine. Durene Romans, Monico Blitz, Jud

## 2022-01-24 ENCOUNTER — Other Ambulatory Visit: Payer: Self-pay

## 2022-01-24 ENCOUNTER — Ambulatory Visit (INDEPENDENT_AMBULATORY_CARE_PROVIDER_SITE_OTHER): Payer: BC Managed Care – PPO | Admitting: Family Medicine

## 2022-01-24 DIAGNOSIS — Z23 Encounter for immunization: Secondary | ICD-10-CM

## 2022-01-24 NOTE — Progress Notes (Signed)
Patient is here for 2nd Shingles vaccine. Denies chest pain, shortness of breath, headaches and problems with medication or mood changes.  Patient tolerated injection well without complications.  No further visits required.  

## 2022-01-25 NOTE — Progress Notes (Signed)
Agree with documentation as above.   Harry Creek, MD  

## 2022-03-05 ENCOUNTER — Emergency Department (INDEPENDENT_AMBULATORY_CARE_PROVIDER_SITE_OTHER)
Admission: EM | Admit: 2022-03-05 | Discharge: 2022-03-05 | Disposition: A | Discharge status: Home / Self Care | Payer: BC Managed Care – PPO | Source: Home / Self Care

## 2022-03-05 DIAGNOSIS — R059 Cough, unspecified: Secondary | ICD-10-CM | POA: Diagnosis not present

## 2022-03-05 DIAGNOSIS — J309 Allergic rhinitis, unspecified: Secondary | ICD-10-CM

## 2022-03-05 DIAGNOSIS — J0100 Acute maxillary sinusitis, unspecified: Secondary | ICD-10-CM

## 2022-03-05 MED ORDER — BENZONATATE 200 MG PO CAPS: 200.0000 mg | ORAL_CAPSULE | Freq: Three times a day (TID) | ORAL | 0 refills | Status: AC | PRN

## 2022-03-05 MED ORDER — PREDNISONE 20 MG PO TABS: ORAL_TABLET | ORAL | 0 refills | Status: DC

## 2022-03-05 MED ORDER — FEXOFENADINE HCL 180 MG PO TABS: 180.0000 mg | ORAL_TABLET | Freq: Every day | ORAL | 0 refills | Status: DC

## 2022-03-05 MED ORDER — AMOXICILLIN-POT CLAVULANATE 875-125 MG PO TABS: 1.0000 | ORAL_TABLET | Freq: Two times a day (BID) | ORAL | 0 refills | Status: AC

## 2022-03-05 NOTE — ED Provider Notes (Signed)
?[place] ? ? ? ?CSN: [id number] ?Arrival date & time: 03/05/22  1039 ? ? ?  ? ?History   ?Chief Complaint ?Chief Complaint  ?Patient presents with  ? Cough  ?  Cough, chest congestion, facial pressure, and nasal congestion. X1 week  ? ? ?HPI ?Harry Lewis is a 56 y.o. male.  ? ?HPI 56 year old male presents with sinus nasal congestion, chest congestion, facial pressure and cough for 1 week.  PMH significant for asthma and GERD. ? ?Past Medical History:  ?Diagnosis Date  ? Allergy   ? Arthritis   ? Asthma   ? GERD (gastroesophageal reflux disease)   ? ? ?Patient Active Problem List  ? Diagnosis Date Noted  ? Moderate persistent asthma with acute exacerbation 10/19/2021  ? ASTHMA, SEASONAL 04/26/2010  ? SEBORRHEIC KERATOSIS 04/26/2010  ? ? ?Past Surgical History:  ?Procedure Laterality Date  ? NO PAST SURGERIES    ? wisdom teeth extration    ? ? ? ? ? ?Home Medications   ? ?Prior to Admission medications   ?Medication Sig Start Date End Date Taking? Authorizing Provider  ?albuterol (VENTOLIN HFA) 108 (90 Base) MCG/ACT inhaler Inhale 2 puffs into the lungs every 4 (four) hours as needed. 10/04/21  Yes Hali Marry, MD  ?amoxicillin-clavulanate (AUGMENTIN) 875-125 MG tablet Take 1 tablet by mouth 2 (two) times daily for 10 days. 03/05/22 03/15/22 Yes Eliezer Lofts, FNP  ?benzonatate (TESSALON) 200 MG capsule Take 1 capsule (200 mg total) by mouth 3 (three) times daily as needed for up to 10 days for cough. 03/05/22 03/15/22 Yes Eliezer Lofts, FNP  ?cetirizine (ZYRTEC) 10 MG tablet Take 10 mg by mouth daily.   Yes [provider]  ?fexofenadine (ALLEGRA ALLERGY) 180 MG tablet Take 1 tablet (180 mg total) by mouth daily for 15 days. 03/05/22 03/20/22 Yes Eliezer Lofts, FNP  ?fluticasone-salmeterol (ADVAIR) 250-50 MCG/ACT AEPB Inhale 2 puffs into the lungs in the morning and at bedtime. 10/19/21  Yes Hali Marry, MD  ?ibuprofen (ADVIL,MOTRIN) 200 MG tablet Take 200 mg by mouth every 6  (six) hours as needed.   Yes [provider]  ?montelukast (SINGULAIR) 10 MG tablet Take 1 tablet (10 mg total) by mouth at bedtime. 10/19/21  Yes Hali Marry, MD  ?predniSONE (DELTASONE) 20 MG tablet Take 3 tabs PO daily x 5 days. 03/05/22  Yes Eliezer Lofts, FNP  ? ? ?Family History ?Family History  ?Problem Relation Age of Onset  ? Diabetes Mother   ? Stroke Mother   ? Throat cancer Father   ?     smoker  ? Colon cancer Neg Hx   ? Colon polyps Neg Hx   ? Esophageal cancer Neg Hx   ? Rectal cancer Neg Hx   ? Stomach cancer Neg Hx   ? ? ?Social History ?Social History  ? ?Tobacco Use  ? Smoking status: Former  ?  Types: Cigarettes  ?  Quit date: 10/07/1988  ?  Years since quitting: 33.4  ? Smokeless tobacco: Never  ?Vaping Use  ? Vaping Use: Never used  ?Substance Use Topics  ? Alcohol use: Yes  ?  Alcohol/week: 6.0 standard drinks  ?  Types: 6 Cans of beer per week  ?  Comment: 5/week  ? Drug use: No  ? ? ? ?Allergies   ?Patient has no known allergies. ? ? ?Review of Systems ?Review of Systems  ?HENT:  Positive for congestion, sinus pressure and sinus pain.   ?  Respiratory:  Positive for cough.   ?All other systems reviewed and are negative. ? ? ?Physical Exam ?Triage Vital Signs ?ED Triage Vitals  ?Enc Vitals Group  ?   BP 03/05/22 1130 138/89  ?   Pulse Rate 03/05/22 1130 68  ?   Resp 03/05/22 1130 18  ?   Temp 03/05/22 1130 98.3 ?F (36.8 ?C)  ?   Temp Source 03/05/22 1130 Oral  ?   SpO2 03/05/22 1130 96 %  ?   Weight 03/05/22 1128 190 lb (86.2 kg)  ?   Height 03/05/22 1128 '5\' 11"'$  (1.803 m)  ?   Head Circumference --   ?   Peak Flow --   ?   Pain Score 03/05/22 1128 0  ?   Pain Loc --   ?   Pain Edu? --   ?   Excl. in [place]? --   ? ?No data found. ? ?Updated Vital Signs ?BP 138/89 (BP Location: Left Arm)   Pulse 68   Temp 98.3 ?F (36.8 ?C) (Oral)   Resp 18   Ht '5\' 11"'$  (1.803 m)   Wt 190 lb (86.2 kg)   SpO2 96%   BMI 26.50 kg/m?  ? ? ?Physical Exam ?Vitals and nursing note reviewed.   ?Constitutional:   ?   General: He is not in acute distress. ?   Appearance: Normal appearance. He is obese. He is not ill-appearing.  ?HENT:  ?   Head: Normocephalic and atraumatic.  ?   Right Ear: Tympanic membrane and external ear normal.  ?   Left Ear: Tympanic membrane and external ear normal.  ?   Ears:  ?   Comments: Moderate eustachian tube dysfunction noted bilaterally ?   Nose:  ?   Comments: Turbinates are erythematous/edematous ?   Mouth/Throat:  ?   Mouth: Mucous membranes are moist.  ?   Pharynx: Oropharynx is clear.  ?   Comments: Moderate to significant amount of clear drainage in posterior oropharynx noted ?Eyes:  ?   Conjunctiva/sclera: Conjunctivae normal.  ?   Pupils: Pupils are equal, round, and reactive to light.  ?Cardiovascular:  ?   Rate and Rhythm: Normal rate and regular rhythm.  ?   Pulses: Normal pulses.  ?   Heart sounds: Normal heart sounds.  ?Pulmonary:  ?   Effort: Pulmonary effort is normal.  ?   Breath sounds: Normal breath sounds. No wheezing, rhonchi or rales.  ?   Comments: Frequent nonproductive cough noted on exam ?Musculoskeletal:  ?   Cervical back: Normal range of motion and neck supple.  ?Skin: ?   General: Skin is warm and dry.  ?Neurological:  ?   General: No focal deficit present.  ?   Mental Status: He is alert and oriented to person, place, and time. Mental status is at baseline.  ? ? ? ?UC Treatments / Results  ?Labs ?(all labs ordered are listed, but only abnormal results are displayed) ?Labs Reviewed - No data to display ? ?EKG ? ? ?Radiology ?No results found. ? ?Procedures ?Procedures (including critical care time) ? ?Medications Ordered in UC ?Medications - No data to display ? ?Initial Impression / Assessment and Plan / UC Course  ?I have reviewed the triage vital signs and the nursing notes. ? ?Pertinent labs & imaging results that were available during my care of the patient were reviewed by me and considered in my medical decision making (see chart for  details). ? ?  ? ?  MDM: 1.  Subacute maxillary sinusitis-Rx'd Augmentin; 2.  Cough-Rx'd prednisone, Tessalon Perles; 3.  Allergic rhinitis-Rx'd Allegra. Instructed patient to take medication as directed with food to completion.  Instructed patient to discontinue daily Zyrtec.  Advised patient to take prednisone and Allegra with first dose of Augmentin for the next 5 of 10 days.  Advised may use Allegra as needed afterwards for concurrent postnasal drainage/drip.  Advised may use Tessalon Perles daily or as needed for cough.  Encouraged patient to increase daily water intake while taking these medications.  Patient discharged home, hemodynamically stable. ?Final Clinical Impressions(s) / UC Diagnoses  ? ?Final diagnoses:  ?Cough, unspecified type  ?Subacute maxillary sinusitis  ?Allergic rhinitis, unspecified seasonality, unspecified trigger  ? ? ? ?Discharge Instructions   ? ?  ?Instructed patient to take medication as directed with food to completion.  Instructed patient to discontinue daily Zyrtec.  Advised patient to take prednisone and Allegra with first dose of Augmentin for the next 5 of 10 days.  Advised may use Allegra as needed afterwards for concurrent postnasal drainage/drip.  Advised may use Tessalon Perles daily or as needed for cough.  Encouraged patient to increase daily water intake while taking these medications. ? ? ? ? ?ED Prescriptions   ? ? Medication Sig Dispense Auth. Provider  ? amoxicillin-clavulanate (AUGMENTIN) 875-125 MG tablet Take 1 tablet by mouth 2 (two) times daily for 10 days. 20 tablet Eliezer Lofts, FNP  ? predniSONE (DELTASONE) 20 MG tablet Take 3 tabs PO daily x 5 days. 15 tablet Eliezer Lofts, FNP  ? fexofenadine (ALLEGRA ALLERGY) 180 MG tablet Take 1 tablet (180 mg total) by mouth daily for 15 days. 15 tablet Eliezer Lofts, FNP  ? benzonatate (TESSALON) 200 MG capsule Take 1 capsule (200 mg total) by mouth 3 (three) times daily as needed for up to 10 days for cough. 40  capsule Eliezer Lofts, FNP  ? ?  ? ?PDMP not reviewed this encounter. ?  ?Eliezer Lofts, [place] ?03/05/22 1321 ? ?

## 2022-03-05 NOTE — ED Triage Notes (Signed)
Pt states that he has a cough, chest congestion, facial pressure and nasal congestion. X1 week ? ?Pt states that he had a negative covid test today. 03/05/2022. ? ?Pt states that he is vaccinated for covid.  ?Pt states that he has had flu vaccine.  ?

## 2022-03-05 NOTE — Discharge Instructions (Addendum)
Instructed patient to take medication as directed with food to completion.  Instructed patient to discontinue daily Zyrtec.  Advised patient to take prednisone and Allegra with first dose of Augmentin for the next 5 of 10 days.  Advised may use Allegra as needed afterwards for concurrent postnasal drainage/drip.  Advised may use Tessalon Perles daily or as needed for cough.  Encouraged patient to increase daily water intake while taking these medications. ?

## 2022-09-08 ENCOUNTER — Ambulatory Visit
Admission: EM | Admit: 2022-09-08 | Discharge: 2022-09-08 | Disposition: A | Discharge status: Home / Self Care | Payer: BC Managed Care – PPO | Attending: Family Medicine | Admitting: Family Medicine

## 2022-09-08 ENCOUNTER — Other Ambulatory Visit: Payer: Self-pay

## 2022-09-08 DIAGNOSIS — R059 Cough, unspecified: Secondary | ICD-10-CM

## 2022-09-08 DIAGNOSIS — J309 Allergic rhinitis, unspecified: Secondary | ICD-10-CM | POA: Diagnosis not present

## 2022-09-08 DIAGNOSIS — J0100 Acute maxillary sinusitis, unspecified: Secondary | ICD-10-CM | POA: Diagnosis not present

## 2022-09-08 MED ORDER — AMOXICILLIN-POT CLAVULANATE 875-125 MG PO TABS: 1.0000 | ORAL_TABLET | Freq: Two times a day (BID) | ORAL | 0 refills | Status: DC

## 2022-09-08 MED ORDER — FEXOFENADINE HCL 180 MG PO TABS: 180.0000 mg | ORAL_TABLET | Freq: Every day | ORAL | 0 refills | Status: DC

## 2022-09-08 MED ORDER — PREDNISONE 20 MG PO TABS: ORAL_TABLET | ORAL | 0 refills | Status: DC

## 2022-09-08 MED ORDER — PROMETHAZINE-DM 6.25-15 MG/5ML PO SYRP: 5.0000 mL | ORAL_SOLUTION | Freq: Two times a day (BID) | ORAL | 0 refills | Status: DC | PRN

## 2022-09-08 NOTE — Discharge Instructions (Addendum)
Advised patient to take medication as directed with food to completion.  Advised patient to take Allegra and prednisone with first dose of Augmentin for the next 5 of 10 days.  Advised may use Allegra as needed afterwards for concurrent postnasal drainage/drip.  Advised may use Promethazine DM for cough at night prior to sleep due to sedative effects.  Encouraged patient to increase daily water intake while taking these medications.  Advised if symptoms worsen and/or unresolved please follow-up with PCP or here for further evaluation.

## 2022-09-08 NOTE — ED Provider Notes (Signed)
[name]    CSN: [id number] Arrival date & time: 09/08/22  1121      History   Chief Complaint Chief Complaint  Patient presents with   Cough   Sore Throat   Nasal Congestion    HPI Harry Lewis is a 56 y.o. male.   HPI Pleasant 56 year old male presents with cough, nasal congestion, sore throat for 1 week.  Denies fever.  Reports using OTC ibuprofen and Benadryl as needed.  Reports history of sinus infections.  PMH significant for moderate persistent asthma, GERD, and arthritis.  Past Medical History:  Diagnosis Date   Allergy    Arthritis    Asthma    GERD (gastroesophageal reflux disease)     Patient Active Problem List   Diagnosis Date Noted   Moderate persistent asthma with acute exacerbation 10/19/2021   ASTHMA, SEASONAL 04/26/2010   SEBORRHEIC KERATOSIS 04/26/2010    Past Surgical History:  Procedure Laterality Date   NO PAST SURGERIES     wisdom teeth extration         Home Medications    Prior to Admission medications   Medication Sig Start Date End Date Taking? Authorizing Provider  amoxicillin-clavulanate (AUGMENTIN) 875-125 MG tablet Take 1 tablet by mouth every 12 (twelve) hours for 10 days. 09/08/22 09/18/22 Yes Eliezer Lofts, FNP  fexofenadine [facility] ALLERGY) 180 MG tablet Take 1 tablet (180 mg total) by mouth daily for 15 days. 09/08/22 09/23/22 Yes Eliezer Lofts, FNP  predniSONE (DELTASONE) 20 MG tablet Take 3 tabs PO daily x 5 days. 09/08/22  Yes Eliezer Lofts, FNP  promethazine-dextromethorphan (PROMETHAZINE-DM) 6.25-15 MG/5ML syrup Take 5 mLs by mouth 2 (two) times daily as needed for cough. 09/08/22  Yes Eliezer Lofts, FNP  albuterol (VENTOLIN HFA) 108 (90 Base) MCG/ACT inhaler Inhale 2 puffs into the lungs every 4 (four) hours as needed. 10/04/21   Hali Marry, MD  cetirizine (ZYRTEC) 10 MG tablet Take 10 mg by mouth daily.    [provider]  fluticasone-salmeterol (ADVAIR) 250-50 MCG/ACT AEPB  Inhale 2 puffs into the lungs in the morning and at bedtime. 10/19/21   Hali Marry, MD  ibuprofen (ADVIL,MOTRIN) 200 MG tablet Take 200 mg by mouth every 6 (six) hours as needed.    [provider]  montelukast (SINGULAIR) 10 MG tablet Take 1 tablet (10 mg total) by mouth at bedtime. 10/19/21   Hali Marry, MD    Family History Family History  Problem Relation Age of Onset   Diabetes Mother    Stroke Mother    Throat cancer Father        smoker   Colon cancer Neg Hx    Colon polyps Neg Hx    Esophageal cancer Neg Hx    Rectal cancer Neg Hx    Stomach cancer Neg Hx     Social History Social History   Tobacco Use   Smoking status: Former    Types: Cigarettes    Quit date: 10/07/1988    Years since quitting: 33.9   Smokeless tobacco: Never  Vaping Use   Vaping Use: Never used  Substance Use Topics   Alcohol use: Yes    Alcohol/week: 6.0 standard drinks of alcohol    Types: 6 Cans of beer per week    Comment: 5/week   Drug use: No     Allergies   Patient has no known allergies.   Review of Systems Review of Systems  HENT:  Positive for congestion  and postnasal drip.   Respiratory:  Positive for cough.   All other systems reviewed and are negative.    Physical Exam Triage Vital Signs ED Triage Vitals  Enc Vitals Group     BP 09/08/22 1146 (!) 155/83     Pulse Rate 09/08/22 1146 71     Resp 09/08/22 1146 18     Temp 09/08/22 1146 98.2 F (36.8 C)     Temp Source 09/08/22 1146 Oral     SpO2 09/08/22 1146 96 %     Weight --      Height --      Head Circumference --      Peak Flow --      Pain Score 09/08/22 1147 0     Pain Loc --      Pain Edu? --      Excl. in [place]? --    No data found.  Updated Vital Signs BP (!) 155/83 (BP Location: Right Arm)   Pulse 71   Temp 98.2 F (36.8 C) (Oral)   Resp 18   SpO2 96%     Physical Exam Vitals and nursing note reviewed.  Constitutional:      Appearance: Normal appearance.  He is normal weight. He is ill-appearing.  HENT:     Head: Normocephalic and atraumatic.     Right Ear: Tympanic membrane and external ear normal.     Left Ear: Tympanic membrane and external ear normal.     Ears:     Comments: Moderate eustachian tube dysfunction noted bilaterally    Nose:     Comments: Turbinates erythematous/edematous    Mouth/Throat:     Mouth: Mucous membranes are moist.     Pharynx: Oropharynx is clear.     Comments: Significant amount of clear drainage of posterior oropharynx noted Eyes:     Extraocular Movements: Extraocular movements intact.     Conjunctiva/sclera: Conjunctivae normal.     Pupils: Pupils are equal, round, and reactive to light.  Cardiovascular:     Rate and Rhythm: Normal rate and regular rhythm.  Pulmonary:     Effort: Pulmonary effort is normal.     Breath sounds: No wheezing, rhonchi or rales.     Comments: Diminished air intake at bases, frequent nonproductive cough noted on exam Musculoskeletal:        General: Normal range of motion.     Cervical back: Normal range of motion and neck supple.  Skin:    General: Skin is warm and dry.  Neurological:     General: No focal deficit present.     Mental Status: He is alert and oriented to person, place, and time.      UC Treatments / Results  Labs (all labs ordered are listed, but only abnormal results are displayed) Labs Reviewed - No data to display  EKG   Radiology No results found.  Procedures Procedures (including critical care time)  Medications Ordered in UC Medications - No data to display  Initial Impression / Assessment and Plan / UC Course  I have reviewed the triage vital signs and the nursing notes.  Pertinent labs & imaging results that were available during my care of the patient were reviewed by me and considered in my medical decision making (see chart for details).     MDM: 1.  Acute maxillary sinusitis, recurrence not specified-Rx'd Augmentin; 2.   Cough-Rx'd prednisone, Promethazine DM.;  3.  Allergic rhinitis-Rx'd Allegra. Advised patient to take Allegra and  prednisone with first dose of Augmentin for the next 5 of 10 days.  Advised may use Allegra as needed afterwards for concurrent postnasal drainage/drip.  Advised may use Promethazine DM for cough at night prior to sleep due to sedative effects.  Encouraged patient to increase daily water intake while taking these medications.  Advised if symptoms worsen and/or unresolved please follow-up with PCP or here for further evaluation. Final Clinical Impressions(s) / UC Diagnoses   Final diagnoses:  Acute maxillary sinusitis, recurrence not specified  Cough, unspecified type  Allergic rhinitis, unspecified seasonality, unspecified trigger     Discharge Instructions      Advised patient to take medication as directed with food to completion.  Advised patient to take Allegra and prednisone with first dose of Augmentin for the next 5 of 10 days.  Advised may use Allegra as needed afterwards for concurrent postnasal drainage/drip.  Advised may use Promethazine DM for cough at night prior to sleep due to sedative effects.  Encouraged patient to increase daily water intake while taking these medications.  Advised if symptoms worsen and/or unresolved please follow-up with PCP or here for further evaluation.     ED Prescriptions     Medication Sig Dispense Auth. Provider   amoxicillin-clavulanate (AUGMENTIN) 875-125 MG tablet Take 1 tablet by mouth every 12 (twelve) hours for 10 days. 20 tablet Eliezer Lofts, FNP   fexofenadine [facility] ALLERGY) 180 MG tablet Take 1 tablet (180 mg total) by mouth daily for 15 days. 15 tablet Eliezer Lofts, FNP   predniSONE (DELTASONE) 20 MG tablet Take 3 tabs PO daily x 5 days. 15 tablet Eliezer Lofts, FNP   promethazine-dextromethorphan (PROMETHAZINE-DM) 6.25-15 MG/5ML syrup Take 5 mLs by mouth 2 (two) times daily as needed for cough. 118 mL Eliezer Lofts,  FNP      PDMP not reviewed this encounter.   Eliezer Lofts, [place] 09/08/22 1218

## 2022-09-08 NOTE — ED Triage Notes (Signed)
Pt c/o cough, nasal congestion and sore throat x 1 week. Denies fever. Ibuprofen and benedryl prn. Hx of sinus infection.

## 2022-09-09 ENCOUNTER — Telehealth: Payer: Self-pay

## 2022-09-09 NOTE — Telephone Encounter (Signed)
TC to f/u after yesterday's visit to [facility]. No answer; left VM to call [phone] for problems or questions. 

## 2022-09-13 ENCOUNTER — Encounter: Payer: Self-pay | Admitting: Family Medicine

## 2022-09-13 ENCOUNTER — Ambulatory Visit
Admission: EM | Admit: 2022-09-13 | Discharge: 2022-09-13 | Disposition: A | Discharge status: Home / Self Care | Payer: BC Managed Care – PPO | Attending: Family Medicine | Admitting: Family Medicine

## 2022-09-13 ENCOUNTER — Ambulatory Visit (INDEPENDENT_AMBULATORY_CARE_PROVIDER_SITE_OTHER): Payer: BC Managed Care – PPO

## 2022-09-13 DIAGNOSIS — J4541 Moderate persistent asthma with (acute) exacerbation: Secondary | ICD-10-CM

## 2022-09-13 DIAGNOSIS — R059 Cough, unspecified: Secondary | ICD-10-CM

## 2022-09-13 DIAGNOSIS — J4521 Mild intermittent asthma with (acute) exacerbation: Secondary | ICD-10-CM

## 2022-09-13 MED ORDER — HYDROCODONE BIT-HOMATROP MBR 5-1.5 MG/5ML PO SOLN: 5.0000 mL | Freq: Three times a day (TID) | ORAL | 0 refills | Status: DC | PRN

## 2022-09-13 MED ORDER — ALBUTEROL SULFATE HFA 108 (90 BASE) MCG/ACT IN AERS: 2.0000 | INHALATION_SPRAY | RESPIRATORY_TRACT | 0 refills | Status: DC | PRN

## 2022-09-13 NOTE — ED Triage Notes (Signed)
Cough  is worse at night  Still on antibiotic  Finished steroids  Unable to sleep at night due to cough  Intermittent chills  Feels like "I can't take a deep breathe"  Using albuterol inhaler  TID or as needed- min help

## 2022-09-13 NOTE — ED Provider Notes (Signed)
[facility]    CSN: [id number] Arrival date & time: 09/13/22  1716      History   Chief Complaint Chief Complaint  Patient presents with   Cough    HPI Harry Lewis is a 56 y.o. male.   HPI 56 year old male presents with worsening cough at night.  Patient evaluated by me on 09/08/2022 for acute maxillary sinusitis.  Patient was prescribed Augmentin, prednisone, Promethazine DM, and Allegra.  PMH significant for moderate persistent asthma, seasonal allergies, and seborrheic keratosis.  Past Medical History:  Diagnosis Date   Allergy    Arthritis    Asthma    GERD (gastroesophageal reflux disease)     Patient Active Problem List   Diagnosis Date Noted   Moderate persistent asthma with acute exacerbation 10/19/2021   ASTHMA, SEASONAL 04/26/2010   SEBORRHEIC KERATOSIS 04/26/2010    Past Surgical History:  Procedure Laterality Date   NO PAST SURGERIES     wisdom teeth extration         Home Medications    Prior to Admission medications   Medication Sig Start Date End Date Taking? Authorizing Provider  HYDROcodone bit-homatropine (HYCODAN) 5-1.5 MG/5ML syrup Take 5 mLs by mouth every 8 (eight) hours as needed for cough. 09/13/22  Yes Eliezer Lofts, FNP  albuterol (VENTOLIN HFA) 108 (90 Base) MCG/ACT inhaler Inhale 2 puffs into the lungs every 4 (four) hours as needed. 09/13/22   Eliezer Lofts, FNP  amoxicillin-clavulanate (AUGMENTIN) 875-125 MG tablet Take 1 tablet by mouth every 12 (twelve) hours for 10 days. 09/08/22 09/18/22  Eliezer Lofts, FNP  fexofenadine (ALLEGRA ALLERGY) 180 MG tablet Take 1 tablet (180 mg total) by mouth daily for 15 days. 09/08/22 09/23/22  Eliezer Lofts, FNP  fluticasone-salmeterol (ADVAIR) 250-50 MCG/ACT AEPB Inhale 2 puffs into the lungs in the morning and at bedtime. 10/19/21   Hali Marry, MD  ibuprofen (ADVIL,MOTRIN) 200 MG tablet Take 200 mg by mouth every 6 (six) hours as needed.    [provider]   montelukast (SINGULAIR) 10 MG tablet Take 1 tablet (10 mg total) by mouth at bedtime. 10/19/21   Hali Marry, MD    Family History Family History  Problem Relation Age of Onset   Diabetes Mother    Stroke Mother    Throat cancer Father        smoker   Colon cancer Neg Hx    Colon polyps Neg Hx    Esophageal cancer Neg Hx    Rectal cancer Neg Hx    Stomach cancer Neg Hx     Social History Social History   Tobacco Use   Smoking status: Former    Types: Cigarettes    Quit date: 10/07/1988    Years since quitting: 33.9   Smokeless tobacco: Never  Vaping Use   Vaping Use: Never used  Substance Use Topics   Alcohol use: Yes    Alcohol/week: 6.0 standard drinks of alcohol    Types: 6 Cans of beer per week    Comment: 5/week   Drug use: No     Allergies   Patient has no known allergies.   Review of Systems Review of Systems  Respiratory:  Positive for cough.   All other systems reviewed and are negative.    Physical Exam Triage Vital Signs ED Triage Vitals  Enc Vitals Group     BP      Pulse      Resp  Temp      Temp src      SpO2      Weight      Height      Head Circumference      Peak Flow      Pain Score      Pain Loc      Pain Edu?      Excl. in [place]?    No data found.  Updated Vital Signs BP (!) 139/91 (BP Location: Left Arm)   Pulse 67   Temp 98.9 F (37.2 C) (Oral)   Resp 20   Ht '5\' 11"'$  (1.803 m)   Wt 195 lb (88.5 kg)   SpO2 96%   BMI 27.20 kg/m   Physical Exam Vitals and nursing note reviewed.  Constitutional:      Appearance: Normal appearance. He is normal weight.  HENT:     Head: Normocephalic and atraumatic.     Right Ear: Tympanic membrane, ear canal and external ear normal.     Left Ear: Tympanic membrane, ear canal and external ear normal.     Mouth/Throat:     Mouth: Mucous membranes are moist.     Pharynx: Oropharynx is clear.  Eyes:     Extraocular Movements: Extraocular movements intact.      Conjunctiva/sclera: Conjunctivae normal.     Pupils: Pupils are equal, round, and reactive to light.  Cardiovascular:     Rate and Rhythm: Normal rate and regular rhythm.     Pulses: Normal pulses.     Heart sounds: Normal heart sounds.  Pulmonary:     Effort: Pulmonary effort is normal.     Breath sounds: Normal breath sounds. No wheezing, rhonchi or rales.     Comments: Infrequent nonproductive cough noted on exam Musculoskeletal:        General: Normal range of motion.     Cervical back: Normal range of motion and neck supple.  Skin:    General: Skin is warm and dry.  Neurological:     General: No focal deficit present.     Mental Status: He is alert and oriented to person, place, and time.      UC Treatments / Results  Labs (all labs ordered are listed, but only abnormal results are displayed) Labs Reviewed - No data to display  EKG   Radiology DG Chest 2 View  Result Date: 09/13/2022 CLINICAL DATA:  Cough for 1 week. EXAM: CHEST - 2 VIEW COMPARISON:  09/14/2016 FINDINGS: The cardiomediastinal contours are normal. The lungs are clear. Pulmonary vasculature is normal. No consolidation, pleural effusion, or pneumothorax. No acute osseous abnormalities are seen. IMPRESSION: No acute chest findings. Electronically Signed   By: Keith Rake M.D.   On: 09/13/2022 18:10    Procedures Procedures (including critical care time)  Medications Ordered in UC Medications - No data to display  Initial Impression / Assessment and Plan / UC Course  I have reviewed the triage vital signs and the nursing notes.  Pertinent labs & imaging results that were available during my care of the patient were reviewed by me and considered in my medical decision making (see chart for details).     MDM: 1. Cough-CXR revealed above, Rx'd Hycodan cough syrup; 2.  Asthma exacerbation-Rx'd/refilled albuterol inhaler. Advised patient chest x-ray was clear.  Directed patient to discontinue  promethazine DM.  Advised may use Hycodan cough syrup prior to sleep for cough due to sedate of effects.  Advised may use albuterol inhaler  as needed for asthma exacerbation.  Advised if symptoms worsen please follow-up with PCP/pulmonology for further evaluation of possible underlying asthma symptoms.  Patient discharged home, hemodynamically stable. Final Clinical Impressions(s) / UC Diagnoses   Final diagnoses:  Cough, unspecified type  Mild intermittent asthma with acute exacerbation     Discharge Instructions      Advised patient chest x-ray was clear.  Directed patient to discontinue promethazine DM.  Advised may use Hycodan cough syrup prior to sleep for cough due to sedate of effects.  Advised may use albuterol inhaler as needed for asthma exacerbation.  Advised if symptoms worsen please follow-up with PCP/pulmonology for further evaluation of possible underlying asthma symptoms.     ED Prescriptions     Medication Sig Dispense Auth. Provider   HYDROcodone bit-homatropine (HYCODAN) 5-1.5 MG/5ML syrup Take 5 mLs by mouth every 8 (eight) hours as needed for cough. 120 mL Eliezer Lofts, FNP   albuterol (VENTOLIN HFA) 108 (90 Base) MCG/ACT inhaler Inhale 2 puffs into the lungs every 4 (four) hours as needed. 6.7 g Eliezer Lofts, FNP      I have reviewed the PDMP during this encounter.   Eliezer Lofts, [place] 09/13/22 1840

## 2022-09-13 NOTE — Discharge Instructions (Addendum)
Advised patient chest x-ray was clear.  Directed patient to discontinue Promethazine DM.  Advised may use Hycodan cough syrup prior to sleep for cough due to sedate of effects.  Advised may use albuterol inhaler as needed for asthma exacerbation.  Advised if symptoms worsen please follow-up with PCP/pulmonology for further evaluation of possible underlying asthma symptoms.

## 2022-09-14 MED ORDER — MONTELUKAST SODIUM 10 MG PO TABS: 10.0000 mg | ORAL_TABLET | Freq: Every day | ORAL | 3 refills | Status: DC

## 2022-09-14 MED ORDER — FLUTICASONE-SALMETEROL 250-50 MCG/ACT IN AEPB: 2.0000 | INHALATION_SPRAY | Freq: Two times a day (BID) | RESPIRATORY_TRACT | 3 refills | Status: DC

## 2022-09-14 NOTE — Telephone Encounter (Signed)
Meds ordered this encounter  Medications   fluticasone-salmeterol (ADVAIR) 250-50 MCG/ACT AEPB    Sig: Inhale 2 puffs into the lungs in the morning and at bedtime.    Dispense:  60 each    Refill:  3   montelukast (SINGULAIR) 10 MG tablet    Sig: Take 1 tablet (10 mg total) by mouth at bedtime.    Dispense:  90 tablet    Refill:  3   I would recommend that we get him in's for spirometry here first before pulmonary referral.  I did go ahead and refill his medications I would agree, I do think he should restart them to try to control his asthma better.

## 2022-09-18 ENCOUNTER — Ambulatory Visit (INDEPENDENT_AMBULATORY_CARE_PROVIDER_SITE_OTHER): Payer: BC Managed Care – PPO | Admitting: Family Medicine

## 2022-09-18 VITALS — BP 123/70 | HR 76 | Ht 71.0 in | Wt 198.0 lb

## 2022-09-18 DIAGNOSIS — R059 Cough, unspecified: Secondary | ICD-10-CM

## 2022-09-18 DIAGNOSIS — J4541 Moderate persistent asthma with (acute) exacerbation: Secondary | ICD-10-CM

## 2022-09-18 MED ORDER — MONTELUKAST SODIUM 10 MG PO TABS: 10.0000 mg | ORAL_TABLET | Freq: Every day | ORAL | 3 refills | Status: DC

## 2022-09-18 MED ORDER — IPRATROPIUM-ALBUTEROL 0.5-2.5 (3) MG/3ML IN SOLN
3.0000 mL | Freq: Once | RESPIRATORY_TRACT | Status: AC
  Administered 2022-09-18: 3 mL via RESPIRATORY_TRACT

## 2022-09-18 MED ORDER — PREDNISONE 20 MG PO TABS: 40.0000 mg | ORAL_TABLET | Freq: Every day | ORAL | 0 refills | Status: DC

## 2022-09-18 MED ORDER — AZITHROMYCIN 250 MG PO TABS: ORAL_TABLET | ORAL | 0 refills | Status: AC

## 2022-09-18 MED ORDER — FLUTICASONE-SALMETEROL 250-50 MCG/ACT IN AEPB: 2.0000 | INHALATION_SPRAY | Freq: Two times a day (BID) | RESPIRATORY_TRACT | 4 refills | Status: DC

## 2022-09-18 NOTE — Progress Notes (Signed)
Established Patient Office Visit  Subjective   Patient ID: Harry Lewis, male    DOB: 01/01/1966  Age: 56 y.o. MRN: [id number]  Chief Complaint  Patient presents with   Shortness of Breath         HPI  He was seen on November 24 and diagnosed with an acute sinusitis.  He was treated with Augmentin and cough syrup.  He then went back to urgent care November 9th for persistent cough at night.  At that point in time he was given a different prescription cough syrup and they refilled his albuterol they felt like there was some component of asthma exacerbation going on.  Since then he has been using his albuterol 3 times a day.  He does typically flare in the winter but this is early for him.  He was feeling like he was having difficulty catching his breath.  Chest x-ray on 11/9 was normal.  Does feel like the sinus symptoms are better though he still has a little bit of postnasal drip.  He just feels like there is something blocking that airway in the mid chest area like he cannot get a deep breath in.  He has not noticed any wheezing this week.    ROS    Objective:     BP 123/70   Pulse 76   Ht '5\' 11"'$  (1.803 m)   Wt 198 lb (89.8 kg)   SpO2 97%   PF 500 L/min Comment: [id number] green zone  BMI 27.62 kg/m    Physical Exam Constitutional:      Appearance: He is well-developed.  HENT:     Head: Normocephalic and atraumatic.     Right Ear: Tympanic membrane, ear canal and external ear normal.     Left Ear: Tympanic membrane, ear canal and external ear normal.     Nose: Nose normal.     Mouth/Throat:     Pharynx: Oropharynx is clear.  Eyes:     Conjunctiva/sclera: Conjunctivae normal.     Pupils: Pupils are equal, round, and reactive to light.  Neck:     Thyroid: No thyromegaly.  Cardiovascular:     Rate and Rhythm: Normal rate and regular rhythm.     Heart sounds: Normal heart sounds.  Pulmonary:     Effort: Pulmonary effort is normal.     Breath sounds: Normal breath  sounds.  Musculoskeletal:     Cervical back: Neck supple. No tenderness.  Lymphadenopathy:     Cervical: No cervical adenopathy.  Skin:    General: Skin is warm and dry.  Neurological:     Mental Status: He is alert and oriented to person, place, and time.  Psychiatric:        Behavior: Behavior normal.      No results found for any visits on 09/18/22.    The ASCVD Risk score (Arnett DK, et al., 2019) failed to calculate for the following reasons:   Cannot find a previous HDL lab   Cannot find a previous total cholesterol lab    Assessment & Plan:   Problem List Items Addressed This Visit       Respiratory   Moderate persistent asthma with acute exacerbation   Relevant Medications   predniSONE (DELTASONE) 20 MG tablet   fluticasone-salmeterol (ADVAIR) 250-50 MCG/ACT AEPB   montelukast (SINGULAIR) 10 MG tablet   Other Visit Diagnoses     Cough, unspecified type    -  Primary   Relevant Medications  ipratropium-albuterol (DUONEB) 0.5-2.5 (3) MG/3ML nebulizer solution 3 mL (Completed)      Asthma exacerbation -we will treat with prednisone and azithromycin..  Given Neb treatment in the office.    Step up therapy for his asthma for moderate persistent asthma.  Restart Advair and Singulair.  If not improving over the next 2 to 3 weeks then please let us know.  So discussed the possibility of doing some allergy testing if he would like.  There are 2 allergy groups here locally that could be helpful for him his allergies always seem worse and asthma is worse in the winter.  No follow-ups on file.    Beatrice Lecher, MD

## 2022-09-18 NOTE — Telephone Encounter (Signed)
Patient scheduled Monday 11/20 (patient availability) at [facility] for test. [facility]

## 2022-09-18 NOTE — Progress Notes (Signed)
Peak flow before nebulizer 500 L/min Peak flow after nebulizer 450 L/min

## 2022-09-18 NOTE — Telephone Encounter (Signed)
Please contact the patient to schedule a spirometry test with Nursing, follow by an appointment with Dr. Madilyn Fireman. Thanks in advance.

## 2022-09-24 ENCOUNTER — Ambulatory Visit (INDEPENDENT_AMBULATORY_CARE_PROVIDER_SITE_OTHER): Payer: BC Managed Care – PPO | Admitting: Family Medicine

## 2022-09-24 ENCOUNTER — Encounter: Payer: Self-pay | Admitting: Family Medicine

## 2022-09-24 VITALS — BP 113/71 | HR 71 | Wt 198.0 lb

## 2022-09-24 DIAGNOSIS — R5383 Other fatigue: Secondary | ICD-10-CM | POA: Diagnosis not present

## 2022-09-24 DIAGNOSIS — J4541 Moderate persistent asthma with (acute) exacerbation: Secondary | ICD-10-CM | POA: Diagnosis not present

## 2022-09-24 MED ORDER — ALBUTEROL SULFATE HFA 108 (90 BASE) MCG/ACT IN AERS
4.0000 | INHALATION_SPRAY | Freq: Once | RESPIRATORY_TRACT | Status: AC
  Administered 2022-09-24: 4 via RESPIRATORY_TRACT

## 2022-09-24 NOTE — Progress Notes (Unsigned)
   Established Patient Office Visit  Subjective   Patient ID: Harry Lewis, male    DOB: 1966-06-07  Age: 56 y.o. MRN: [id number]  Chief Complaint  Patient presents with   Asthma    Worsening asthma - spirometry- nurse visit    HPI  He was seen recently for asthma exacerbation he did complete his antibiotics and his steroids 2 days ago.  He says he is feeling about 80% better and definitely feels like he is able to move air more efficiently.  He is here today for spirometry.  Still just feels easily fatigued and like he could fall asleep easily.  {History (Optional):23778}  ROS    Objective:     BP 113/71   Pulse 71   Wt 198 lb (89.8 kg)   SpO2 97%   BMI 27.62 kg/m  {Vitals History (Optional):23777}  Physical Exam Constitutional:      Appearance: He is well-developed.  HENT:     Head: Normocephalic and atraumatic.  Cardiovascular:     Rate and Rhythm: Normal rate and regular rhythm.     Heart sounds: Normal heart sounds.  Pulmonary:     Effort: Pulmonary effort is normal.     Breath sounds: Normal breath sounds.  Skin:    General: Skin is warm and dry.  Neurological:     Mental Status: He is alert and oriented to person, place, and time.  Psychiatric:        Behavior: Behavior normal.      No results found for any visits on 09/24/22.  {Labs (Optional):23779}  The ASCVD Risk score (Arnett DK, et al., 2019) failed to calculate for the following reasons:   Cannot find a previous HDL lab   Cannot find a previous total cholesterol lab    Assessment & Plan:   Problem List Items Addressed This Visit       Respiratory   Moderate persistent asthma with acute exacerbation - Primary   Relevant Orders   PR EVAL OF BRONCHOSPASM   CBC with Differential/Platelet   Other Visit Diagnoses     Other fatigue       Relevant Orders   CBC with Differential/Platelet       Return if symptoms worsen or fail to improve.    Beatrice Lecher, MD

## 2022-09-24 NOTE — Patient Instructions (Signed)
Please continue with Advair for the next 2 months.  If at that point you are doing well you can decrease down to once a day for few weeks and then taper off gradually.

## 2022-09-25 LAB — CBC WITH DIFFERENTIAL/PLATELET
Absolute Monocytes: 722 cells/uL (ref 200–950)
Basophils Absolute: 57 cells/uL (ref 0–200)
Basophils Relative: 0.7 %
Eosinophils Absolute: 254 cells/uL (ref 15–500)
Eosinophils Relative: 3.1 %
HCT: 45.9 % (ref 38.5–50.0)
Hemoglobin: 16.1 g/dL (ref 13.2–17.1)
Lymphs Abs: 1878 cells/uL (ref 850–3900)
MCH: 32.3 pg (ref 27.0–33.0)
MCHC: 35.1 g/dL (ref 32.0–36.0)
MCV: 92.2 fL (ref 80.0–100.0)
MPV: 8.9 fL (ref 7.5–12.5)
Monocytes Relative: 8.8 %
Neutro Abs: 5289 cells/uL (ref 1500–7800)
Neutrophils Relative %: 64.5 %
Platelets: 242 10*3/uL (ref 140–400)
RBC: 4.98 10*6/uL (ref 4.20–5.80)
RDW: 12.2 % (ref 11.0–15.0)
Total Lymphocyte: 22.9 %
WBC: 8.2 10*3/uL (ref 3.8–10.8)

## 2022-09-25 NOTE — Progress Notes (Signed)
Hi Geoff,  Blood count looks great.  No so worrisome findings no sign of excessively high eosinophils which can indicate acute allergic reaction.  Again looks good.

## 2022-10-01 MED ORDER — FLUTICASONE FUROATE-VILANTEROL 100-25 MCG/ACT IN AEPB: 1.0000 | INHALATION_SPRAY | Freq: Every day | RESPIRATORY_TRACT | 1 refills | Status: DC

## 2022-10-01 NOTE — Telephone Encounter (Signed)
Meds ordered this encounter  Medications   DISCONTD: fluticasone-salmeterol (ADVAIR) 250-50 MCG/ACT AEPB    Sig: Inhale 2 puffs into the lungs in the morning and at bedtime.    Dispense:  60 each    Refill:  3   DISCONTD: montelukast (SINGULAIR) 10 MG tablet    Sig: Take 1 tablet (10 mg total) by mouth at bedtime.    Dispense:  90 tablet    Refill:  3   fluticasone furoate-vilanterol (BREO ELLIPTA) 100-25 MCG/ACT AEPB    Sig: Inhale 1 puff into the lungs daily.    Dispense:  60 each    Refill:  1

## 2022-10-03 MED ORDER — FLUTICASONE-SALMETEROL 250-50 MCG/ACT IN AEPB: 1.0000 | INHALATION_SPRAY | Freq: Two times a day (BID) | RESPIRATORY_TRACT | 2 refills | Status: DC

## 2022-10-03 NOTE — Addendum Note (Signed)
Addended by: Beatrice Lecher D on: 10/03/2022 07:56 AM   Modules accepted: Orders

## 2022-10-26 ENCOUNTER — Telehealth: Payer: Self-pay | Admitting: Family Medicine

## 2022-10-26 NOTE — Telephone Encounter (Signed)
Please call patient and let him know that I did look over the old spirometry test that he dropped off from 2019.  Great news is that there is no significant change over the last 4 years.  Still some mild obstruction similar to what we saw but again no significant change from 4 years ago.

## 2022-10-26 NOTE — Telephone Encounter (Signed)
Patient advised.

## 2022-12-13 DIAGNOSIS — M79671 Pain in right foot: Secondary | ICD-10-CM | POA: Diagnosis not present

## 2022-12-13 DIAGNOSIS — M25571 Pain in right ankle and joints of right foot: Secondary | ICD-10-CM | POA: Diagnosis not present

## 2022-12-13 DIAGNOSIS — M2021 Hallux rigidus, right foot: Secondary | ICD-10-CM | POA: Diagnosis not present

## 2022-12-28 DIAGNOSIS — M25571 Pain in right ankle and joints of right foot: Secondary | ICD-10-CM | POA: Diagnosis not present

## 2022-12-28 DIAGNOSIS — M65871 Other synovitis and tenosynovitis, right ankle and foot: Secondary | ICD-10-CM | POA: Diagnosis not present

## 2023-01-11 DIAGNOSIS — M7751 Other enthesopathy of right foot: Secondary | ICD-10-CM | POA: Diagnosis not present

## 2023-01-11 DIAGNOSIS — M25571 Pain in right ankle and joints of right foot: Secondary | ICD-10-CM | POA: Diagnosis not present

## 2023-01-25 DIAGNOSIS — M25571 Pain in right ankle and joints of right foot: Secondary | ICD-10-CM | POA: Diagnosis not present

## 2023-08-23 ENCOUNTER — Encounter: Payer: Self-pay | Admitting: Family Medicine

## 2024-04-29 ENCOUNTER — Telehealth: Admitting: Physician Assistant

## 2024-04-29 DIAGNOSIS — L739 Follicular disorder, unspecified: Secondary | ICD-10-CM | POA: Diagnosis not present

## 2024-04-29 MED ORDER — CEPHALEXIN 500 MG PO CAPS: 500.0000 mg | ORAL_CAPSULE | Freq: Four times a day (QID) | ORAL | 0 refills | Status: AC

## 2024-04-29 NOTE — Progress Notes (Signed)
 E-Visit for Cellulitis  We are sorry that you are not feeling well. Here is how we plan to help!  Based on what you shared with me it looks like you have a folliculitis -- which looks like areas of skin redness, swelling at the base of hair follicles. Sometimes you can notice pus-filled or fluid filled blisters at some of these areas. It develops as a result of bacteria entering under the skin. Little red spots and/or bleeding can be seen in skin, and tiny surface sacs containing fluid can occur. Fever can be present. Cellulitis is almost always on one side of a body, and the lower limbs are the most common site of involvement.   I have prescribed:  Keflex 500mg  take one by mouth four times a day for 5 days  HOME CARE:  Take your medications as ordered and take all of them, even if the skin irritation appears to be healing.   GET HELP RIGHT AWAY IF:  Symptoms that don't begin to go away within 48 hours. Severe redness persists or worsens If the area turns color, spreads or swells. If it blisters and opens, develops yellow-brown crust or bleeds. You develop a fever or chills. If the pain increases or becomes unbearable.  Are unable to keep fluids and food down.  MAKE SURE YOU   Understand these instructions. Will watch your condition. Will get help right away if you are not doing well or get worse.  Thank you for choosing an e-visit.  Your e-visit answers were reviewed by a board certified advanced clinical practitioner to complete your personal care plan. Depending upon the condition, your plan could have included both over the counter or prescription medications.  Please review your pharmacy choice. Make sure the pharmacy is open so you can pick up prescription now. If there is a problem, you may contact your provider through [employer] and have the prescription routed to another pharmacy.  Your safety is important to us . If you have drug allergies check your prescription  carefully.   For the next 24 hours you can use MyChart to ask questions about today's visit, request a non-urgent call back, or ask for a work or school excuse. You will get an email in the next two days asking about your experience. I hope that your e-visit has been valuable and will speed your recovery.

## 2024-04-29 NOTE — Progress Notes (Signed)
 Message sent to patient requesting further input regarding current symptoms. Awaiting patient response.

## 2024-04-29 NOTE — Progress Notes (Signed)
 I have spent 5 minutes in review of e-visit questionnaire, review and updating patient chart, medical decision making and response to patient.   Piedad Climes, PA-C

## 2024-05-23 ENCOUNTER — Encounter: Payer: Self-pay | Admitting: Emergency Medicine

## 2024-05-23 ENCOUNTER — Ambulatory Visit
Admission: EM | Admit: 2024-05-23 | Discharge: 2024-05-23 | Disposition: A | Discharge status: Home / Self Care | Attending: Family Medicine | Admitting: Family Medicine

## 2024-05-23 DIAGNOSIS — L739 Follicular disorder, unspecified: Secondary | ICD-10-CM

## 2024-05-23 MED ORDER — DOXYCYCLINE HYCLATE 100 MG PO CAPS: 100.0000 mg | ORAL_CAPSULE | Freq: Two times a day (BID) | ORAL | 0 refills | Status: DC

## 2024-05-23 NOTE — ED Triage Notes (Signed)
 Patient c/o red dots on his chest x 1 month.  The areas do itch from time to time.  Patient has taken Benadryl.  Did recently take Keflex  for rash w/o improvements.  PCP no available until the end of August.

## 2024-05-23 NOTE — ED Provider Notes (Signed)
 [facility]    CSN: [id number] Arrival date & time: 05/23/24  1145      History   Chief Complaint Chief Complaint  Patient presents with   Rash    HPI Harry Lewis is a 58 y.o. male.   HPI Patient has a rash on his chest for over a month.  It is not itchy.  He said no fever chills malaise or other symptoms.  He has never had this rash before.  No new soap lotion powder or product.  No recent travel.  He did an e-visit and was given Keflex .  Presumed folliculitis.  This did not get better.  He is here for follow-up  Past Medical History:  Diagnosis Date   Allergy    Arthritis    Asthma    GERD (gastroesophageal reflux disease)     Patient Active Problem List   Diagnosis Date Noted   Moderate persistent asthma with acute exacerbation 10/19/2021   Asthma 04/26/2010   SEBORRHEIC KERATOSIS 04/26/2010    Past Surgical History:  Procedure Laterality Date   NO PAST SURGERIES     wisdom teeth extration         Home Medications    Prior to Admission medications   Medication Sig Start Date End Date Taking? Authorizing Provider  albuterol  (VENTOLIN  HFA) 108 (90 Base) MCG/ACT inhaler Inhale 2 puffs into the lungs every 4 (four) hours as needed. 09/13/22  Yes Teddy Sharper, FNP  cetirizine (ZYRTEC) 10 MG tablet Take 10 mg by mouth daily.   Yes [provider]  doxycycline  (VIBRAMYCIN ) 100 MG capsule Take 1 capsule (100 mg total) by mouth 2 (two) times daily. 05/23/24  Yes Maranda Jamee Jacob, MD  ibuprofen (ADVIL,MOTRIN) 200 MG tablet Take 200 mg by mouth every 6 (six) hours as needed.   Yes [provider]  fexofenadine  (ALLEGRA  ALLERGY) 180 MG tablet Take 1 tablet (180 mg total) by mouth daily for 15 days. 09/08/22 09/23/22  Teddy Sharper, FNP    Family History Family History  Problem Relation Age of Onset   Diabetes Mother    Stroke Mother    Throat cancer Father        smoker   Colon cancer Neg Hx    Colon polyps Neg Hx     Esophageal cancer Neg Hx    Rectal cancer Neg Hx    Stomach cancer Neg Hx     Social History Social History   Tobacco Use   Smoking status: Former    Current packs/day: 0.00    Types: Cigarettes    Quit date: 10/07/1988    Years since quitting: 35.6   Smokeless tobacco: Never  Vaping Use   Vaping status: Never Used  Substance Use Topics   Alcohol use: Yes    Alcohol/week: 6.0 standard drinks of alcohol    Types: 6 Cans of beer per week    Comment: 5/week   Drug use: No     Allergies   Patient has no known allergies.   Review of Systems Review of Systems See HPI  Physical Exam Triage Vital Signs ED Triage Vitals  Encounter Vitals Group     BP 05/23/24 1156 (!) 144/96     Girls Systolic BP Percentile --      Girls Diastolic BP Percentile --      Boys Systolic BP Percentile --      Boys Diastolic BP Percentile --      Pulse Rate 05/23/24 1156 64  Resp 05/23/24 1156 18     Temp 05/23/24 1156 98.6 F (37 C)     Temp Source 05/23/24 1156 Oral     SpO2 05/23/24 1156 95 %     Weight 05/23/24 1158 195 lb (88.5 kg)     Height 05/23/24 1158 5' 11 (1.803 m)     Head Circumference --      Peak Flow --      Pain Score 05/23/24 1158 0     Pain Loc --      Pain Education --      Exclude from Growth Chart --    No data found.  Updated Vital Signs BP (!) 144/96 (BP Location: Right Arm)   Pulse 64   Temp 98.6 F (37 C) (Oral)   Resp 18   Ht 5' 11 (1.803 m)   Wt 88.5 kg   SpO2 95%   BMI 27.20 kg/m       Physical Exam Constitutional:      General: He is not in acute distress.    Appearance: He is well-developed.  HENT:     Head: Normocephalic and atraumatic.  Eyes:     Conjunctiva/sclera: Conjunctivae normal.     Pupils: Pupils are equal, round, and reactive to light.  Cardiovascular:     Rate and Rhythm: Normal rate.  Pulmonary:     Effort: Pulmonary effort is normal. No respiratory distress.  Musculoskeletal:        General: Normal range of  motion.     Cervical back: Normal range of motion.  Skin:    General: Skin is warm and dry.     Findings: Rash present.     Comments: Pinpoint red raised rash is visible across chest.  Photo of the rash is present from his E visit.  Neurological:     Mental Status: He is alert.      UC Treatments / Results  Labs (all labs ordered are listed, but only abnormal results are displayed) Labs Reviewed - No data to display  EKG   Radiology No results found.  Procedures Procedures (including critical care time)  Medications Ordered in UC Medications - No data to display  Initial Impression / Assessment and Plan / UC Course  I have reviewed the triage vital signs and the nursing notes.  Pertinent labs & imaging results that were available during my care of the patient were reviewed by me and considered in my medical decision making (see chart for details).    I do feel like this is a folliculitis.  He failed Keflex  therapy.  Will give him Vibramycin  to cover for staph.  Follow-up with PCP Final Clinical Impressions(s) / UC Diagnoses   Final diagnoses:  Folliculitis     Discharge Instructions      Scrub area with chlorhexidine daily (Hibiclens).  This is an antibacterial solution available over-the-counter Take doxycycline  2 times a day.  Take for 10 full days.  Take the doxycycline  with food See your doctor if not improving in a week or 2   ED Prescriptions     Medication Sig Dispense Auth. Provider   doxycycline  (VIBRAMYCIN ) 100 MG capsule Take 1 capsule (100 mg total) by mouth 2 (two) times daily. 20 capsule Maranda Jamee Jacob, MD      PDMP not reviewed this encounter.   Maranda Jamee Jacob, MD 05/23/24 [phone]

## 2024-05-23 NOTE — Discharge Instructions (Signed)
 Scrub area with chlorhexidine daily (Hibiclens).  This is an antibacterial solution available over-the-counter Take doxycycline  2 times a day.  Take for 10 full days.  Take the doxycycline  with food See your doctor if not improving in a week or 2

## 2024-06-30 ENCOUNTER — Ambulatory Visit: Payer: Self-pay

## 2024-06-30 NOTE — Telephone Encounter (Signed)
 FYI Only or Action Required?: Action required by provider: no PCP availability, needs call back with further recommendations/referral to dermatology or earlier appt options.  Patient was last seen in primary care on 09/24/2022 by Alvan Dorothyann BIRCH, MD.  Called Nurse Triage reporting Rash and Pruritis.  Symptoms began several weeks ago.  Interventions attempted: OTC medications: Hibiclens per UC recommendation, Prescription medications: 2 rounds antibx, and Other: virtual appt with health app gave antibx, UC visit gave antibx.  Symptoms are: unchanged.  Triage Disposition: See HCP Within 4 Hours (Or PCP Triage)  Patient/caregiver understands and will follow disposition?: No, wishes to speak with PCP      Message from Alfonso ORN sent at 06/30/2024  8:30 AM EDT  Summary: Pt, has rash in hair focillicles that are red spot on chest with itching ,   Pt, has rash in hair focillicles that are red spot on chest with itching , symptoms not getting in better Pt trying to get an appointment there are no availability until next week  Pt. Call back  [phone]         Reason for Disposition  [1] Localized purple or blood-colored spots or dots AND [2] not from injury or friction AND [3] no fever  Answer Assessment - Initial Assessment Questions 1. APPEARANCE of RASH: What does the rash look like? (e.g., blisters, dry flaky skin, red spots, redness, sores)     Bunch of little dots, like 500 dots all over the chest, blood-colored dots 2. LOCATION: Where is the rash located?      Chest and little bit on shoulder, started on top of chest then spread from there but been unchanged for weeks 3. NUMBER: How many spots are there?      numerous 4. SIZE: How big are the spots? (e.g., inches, cm; or compare to size of pinhead, tip of pen, eraser, pea)      Tip of pen 5. ONSET: When did the rash start?      6 weeks ago 6. ITCHING: Does the rash itch? If Yes, ask: How bad is the  itch?  (Scale 0-10; or none, mild, moderate, severe)     Not too bad, most of time not itchy but if really hot get itchy like mowing the grass, barely scratch during the day 7. PAIN: Does the rash hurt? If Yes, ask: How bad is the pain?  (Scale 0-10; or none, mild, moderate, severe)     No pain 8. OTHER SYMPTOMS: Do you have any other symptoms? (e.g., fever)     No fever, swelling More cosmetic really No open skin or pus, no spreading/streaking redness  Antibx from app virtual appt Antibx from UC, told use Hibiclens not helping either  Work at [employer] facility but nothing new in here   Advised pt be examined in next 4 hours, no PCP availability, sending message to office for call back with further recommendations, pt is open to dermatology referral as not established with dermatologist. [facility] or next available appts on Thursday with PCP office, pt refusing, schedule conflict for Thursday. Advised call back if new or worsening symptoms.  Protocols used: Rash or Redness - Localized-A-AH

## 2024-07-01 NOTE — Telephone Encounter (Signed)
 Patient scheduled 08/29/225 with whitney crain

## 2024-07-03 ENCOUNTER — Ambulatory Visit (INDEPENDENT_AMBULATORY_CARE_PROVIDER_SITE_OTHER): Admitting: Urgent Care

## 2024-07-03 ENCOUNTER — Encounter: Payer: Self-pay | Admitting: Urgent Care

## 2024-07-03 VITALS — BP 123/82 | HR 63 | Resp 16 | Ht 71.0 in | Wt 196.0 lb

## 2024-07-03 DIAGNOSIS — L404 Guttate psoriasis: Secondary | ICD-10-CM

## 2024-07-03 MED ORDER — PREDNISONE 10 MG PO TABS: ORAL_TABLET | ORAL | 0 refills | Status: AC

## 2024-07-03 MED ORDER — AZITHROMYCIN 500 MG PO TABS: ORAL_TABLET | ORAL | 0 refills | Status: DC

## 2024-07-03 NOTE — Progress Notes (Signed)
 Established Patient Office Visit  Subjective:  Patient ID: Harry Lewis, male    DOB: September 30, 1966  Age: 58 y.o. MRN: [id number]  Chief Complaint  Patient presents with   Rash    Chest 2.5 months    HPI  Discussed the use of AI scribe software for clinical note transcription with the patient, who gave verbal consent to proceed.  History of Present Illness   Harry Lewis is a 58 year old male who presents with a persistent rash on the chest and back.  He has had a rash since mid-June 2025, primarily located on the chest, with some involvement of the back and shoulders. The rash is occasionally itchy but not painful or irritating. It does not extend to the armpits, groin, legs, or palms.  He has undergone two rounds of antibiotics with no significant effect. He recalls one spot on the shoulder that had a white, pus-like appearance, resembling a zit. He has not used any steroids or topical treatments for the rash.  He takes Zyrtec daily for allergies and uses ibuprofen as needed for pain. He works in a [employer] but reports no exposure to new chemicals or fumes that could have caused the rash. 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  He has a history of asthma but does not recall any recent upper respiratory symptoms or sore throat prior to the onset of the rash. He has not been in hot tubs frequently.       Patient Active Problem List   Diagnosis Date Noted   Moderate persistent asthma with acute exacerbation 10/19/2021   Asthma 04/26/2010   SEBORRHEIC KERATOSIS 04/26/2010   Past Medical History:  Diagnosis Date   Allergy    Arthritis    Asthma    GERD (gastroesophageal reflux disease)    Past Surgical History:  Procedure Laterality Date   NO PAST SURGERIES     wisdom teeth extration     Social History   Tobacco Use   Smoking status: Former     Current packs/day: 0.00    Types: Cigarettes    Quit date: 10/07/1988    Years since quitting: 35.7   Smokeless tobacco: Never  Vaping Use   Vaping status: Never Used  Substance Use Topics   Alcohol use: Yes    Alcohol/week: 6.0 standard drinks of alcohol    Types: 6 Cans of beer per week    Comment: 5/week   Drug use: No      ROS: as noted in HPI  Objective:     BP 123/82   Pulse 63   Resp 16   Ht 5' 11 (1.803 m)   Wt 196 lb (88.9 kg)   SpO2 98%   BMI 27.34 kg/m  BP Readings from Last 3 Encounters:  07/03/24 123/82  05/23/24 (!) 144/96  09/24/22 113/71   Wt Readings from Last 3 Encounters:  07/03/24 196 lb (88.9 kg)  05/23/24 195 lb (88.5 kg)  09/24/22 198 lb (89.8 kg)      Physical Exam Vitals and nursing note reviewed.  Constitutional:      General: He is not in acute distress.    Appearance: Normal appearance. He is normal weight. He is not ill-appearing or toxic-appearing.  HENT:     Head: Normocephalic and atraumatic.  Cardiovascular:     Rate and Rhythm: Normal rate.  Pulmonary:  Effort: Pulmonary effort is normal. No respiratory distress.  Skin:    General: Skin is warm and dry.     Coloration: Skin is not jaundiced.     Findings: Rash (papular rash to chest) present. No bruising.  Neurological:     General: No focal deficit present.     Mental Status: He is alert and oriented to person, place, and time.      No results found for any visits on 07/03/24.    The ASCVD Risk score (Arnett DK, et al., 2019) failed to calculate for the following reasons:   Cannot find a previous HDL lab   Cannot find a previous total cholesterol lab  Assessment & Plan:  Acute guttate psoriasis -     Azithromycin ; Take one tab PO x 3 days, then 4 days off, then 3 days on, then 4 days off for a total of 3 weeks  Dispense: 9 tablet; Refill: 0 -     predniSONE ; Take 6 tablets (60 mg total) by mouth daily with breakfast for 2 days, THEN 5 tablets (50 mg total)  daily with breakfast for 2 days, THEN 4 tablets (40 mg total) daily with breakfast for 2 days, THEN 3 tablets (30 mg total) daily with breakfast for 2 days, THEN 2 tablets (20 mg total) daily with breakfast for 2 days, THEN 1 tablet (10 mg total) daily with breakfast for 2 days.  Dispense: 42 tablet; Refill: 0 -     Ambulatory referral to Dermatology  Assessment and Plan    Guttate psoriasis Chronic rash since mid-June, primarily on chest, back, and shoulders. Characteristics suggest guttate psoriasis, not folliculitis. High clinical suspicion despite no recent upper respiratory infections. Diagnosis typically clinical, punch biopsy can confirm. - Initiate pulse dosing of azithromycin . - Prescribe prednisone  dose pack. - Refer to dermatology for potential PUVA light therapy if no improvement. - Advise him to get on dermatology cancellation list for earlier appointment.         No follow-ups on file.   Benton LITTIE Gave, PA

## 2024-07-03 NOTE — Patient Instructions (Signed)
 I suspect your rash to be secondary to guttate psoriasis Please so pulse dosing of azithromycin . Take one tab daily x 3 days, then 4 days off, then 3 days on then 4 days off for a total of 3 weeks. Please also take a 12 day taper pack of prednisone . Take all daily prednisone  in a single dose in the morning with breakfast. Please follow up with dermatology for further eval if no resolution. We could also consider a punch biopsy if needed.

## 2024-08-26 ENCOUNTER — Ambulatory Visit: Admitting: Dermatology

## 2024-08-26 ENCOUNTER — Encounter: Payer: Self-pay | Admitting: Dermatology

## 2024-08-26 VITALS — BP 134/86 | HR 72

## 2024-08-26 DIAGNOSIS — R21 Rash and other nonspecific skin eruption: Secondary | ICD-10-CM

## 2024-08-26 DIAGNOSIS — L280 Lichen simplex chronicus: Secondary | ICD-10-CM | POA: Diagnosis not present

## 2024-08-26 MED ORDER — TRIAMCINOLONE ACETONIDE 0.1 % EX CREA: 1.0000 | TOPICAL_CREAM | Freq: Two times a day (BID) | CUTANEOUS | 5 refills | Status: DC

## 2024-08-26 NOTE — Progress Notes (Addendum)
   New Patient Visit   Subjective  Harry Lewis is a 58 y.o. male who presents for the following: Rash  Patient states he  has rash located at the chest that he  would like to have examined. Patient reports the areas have been there for 4 months. He reports the areas can be bothersome.Patient rates irritation 3 out of 10. Patient reports he  has previously been treated for these areas. He was prescribed a prednisone  taper which helped some but towards the end of the medication he would flare again. Patient denies Hx of bx. Patient denies family history of psoriasis.  The following portions of the chart were reviewed this encounter and updated as appropriate: medications, allergies, medical history  Review of Systems:  No other skin or systemic complaints except as noted in HPI or Assessment and Plan.  Objective  Well appearing patient in no apparent distress; mood and affect are within normal limits.   A focused examination was performed of the following areas: Chest  Relevant exam findings are noted in the Assessment and Plan.                Assessment & Plan   Grover's disease (transient acantholytic dermatosis) Chronic, slightly pruritic papules on the chest, consistent with Grover's disease. Symptoms persist since June, exacerbated by sweating. Previous treatments with prednisone  reduced symptoms but did not resolve them; antibiotics with Hibiclens were ineffective. Differential diagnosis includes folliculitis, but presentation and lack of response to antibiotics suggest Grover's disease. Biopsy planned to confirm diagnosis and rule out other conditions such as folliculitis. - Perform skin biopsy to confirm diagnosis and rule out other conditions. - Prescribe triamcinolone 0.1% cream, apply twice daily for two weeks on, two weeks off, until resolved. - Advise to wash with Dove soap instead of harsher soaps. - Instruct to apply Vaseline and a Band-Aid to biopsy site for one to  two weeks to promote healing. - Provide samples of recommended moisturizers (LipKar and Eucerin Advanced Repair). RASH AND OTHER NONSPECIFIC SKIN ERUPTION   Related Medications triamcinolone cream (KENALOG) 0.1 % Apply 1 Application topically 2 (two) times daily. Apply 2 times daily for 2 weeks then STOP if too thick mix with lotion, Alternate 2 weeks on and 2 weeks off RASH Left Breast Epidermal / dermal shaving - Left Breast  Lesion diameter (cm):  0.8 Informed consent: discussed and consent obtained   Timeout: patient name, date of birth, surgical site, and procedure verified   Procedure prep:  Patient was prepped and draped in usual sterile fashion Prep type:  Isopropyl alcohol Anesthesia: the lesion was anesthetized in a standard fashion   Anesthetic:  1% lidocaine w/ epinephrine 1-100,000 buffered w/ 8.4% NaHCO3 Instrument used: DermaBlade   Hemostasis achieved with: aluminum chloride   Outcome: patient tolerated procedure well   Post-procedure details: sterile dressing applied and wound care instructions given   Dressing type: petrolatum   Additional details:  Pt aware that benign results will be sent to mychart and the staff will call abnormal results will  Specimen A - Surgical pathology Differential Diagnosis: R/O Grover's vs other  Check Margins: No  Return in about 2 months (around 10/26/2024) for Rash F/U (Ok'd to DB at 0:15 appt slot per JD).  I, Jetta Ager, am acting as neurosurgeon for [employer], DO.  Documentation: I have reviewed the above documentation for accuracy and completeness, and I agree with the above.  Delon Lenis, DO

## 2024-08-26 NOTE — Patient Instructions (Addendum)
 VISIT SUMMARY:  Today, you were seen for itchy papules on your chest that have been present since June. These papules have been slightly itchy and seem to worsen with sweating. Previous treatments, including prednisone  and antibiotics, have not fully resolved the issue. Based on your symptoms and history, the doctor suspects Grover's disease and has outlined a plan to confirm the diagnosis and manage your symptoms.  YOUR PLAN:  -GROVER'S DISEASE (TRANSIENT ACANTHOLYTIC DERMATOSIS):  Grover's disease is a skin condition characterized by itchy red bumps, often on the chest and back. It can be triggered by sweating and heat.  To confirm the diagnosis, a skin biopsy will be performed. You are prescribed triamcinolone 0.1% cream to apply twice daily for two weeks on, two weeks off, until the condition resolves.  Continue using Dove soap, and apply Vaseline and a Band-Aid to the biopsy site for one to two weeks to help it heal.  Samples of recommended moisturizers (LipKar and Eucerin Advanced Repair) have been provided to help manage dry skin.  INSTRUCTIONS:  Please schedule a follow-up appointment for the skin biopsy. Apply the triamcinolone cream as directed and use the provided moisturizers. If you have any questions or notice any changes, contact our office.     Patient Handout: Wound Care for Skin Biopsy Site  Taking Care of Your Skin Biopsy Site  Proper care of the biopsy site is essential for promoting healing and minimizing scarring. This handout provides instructions on how to care for your biopsy site to ensure optimal recovery.  1. Cleaning the Wound:  Clean the biopsy site daily with gentle soap and water. Gently pat the area dry with a clean, soft towel. Avoid harsh scrubbing or rubbing the area, as this can irritate the skin and delay healing.  2. Applying Aquaphor and Bandage:  After cleaning the wound, apply a thin layer of Aquaphor ointment to the biopsy site. Cover the  area with a sterile bandage to protect it from dirt, bacteria, and friction. Change the bandage daily or as needed if it becomes soiled or wet.  3. Continued Care for One Week:  Repeat the cleaning, Aquaphor application, and bandaging process daily for one week following the biopsy procedure. Keeping the wound clean and moist during this initial healing period will help prevent infection and promote optimal healing.  4. Massaging Aquaphor into the Area:  ---After one week, discontinue the use of bandages but continue to apply Aquaphor to the biopsy site. ----Gently massage the Aquaphor into the area using circular motions. ---Massaging the skin helps to promote circulation and prevent the formation of scar tissue.   Additional Tips:  Avoid exposing the biopsy site to direct sunlight during the healing process, as this can cause hyperpigmentation or worsen scarring. If you experience any signs of infection, such as increased redness, swelling, warmth, or drainage from the wound, contact your healthcare provider immediately. Follow any additional instructions provided by your healthcare provider for caring for the biopsy site and managing any discomfort. Conclusion:  Taking proper care of your skin biopsy site is crucial for ensuring optimal healing and minimizing scarring. By following these instructions for cleaning, applying Aquaphor, and massaging the area, you can promote a smooth and successful recovery. If you have any questions or concerns about caring for your biopsy site, don't hesitate to contact your healthcare provider for guidance.     Important Information   Due to recent changes in healthcare laws, you may see results of your pathology and/or laboratory studies  on MyChart before the doctors have had a chance to review them. We understand that in some cases there may be results that are confusing or concerning to you. Please understand that not all results are received at the  same time and often the doctors may need to interpret multiple results in order to provide you with the best plan of care or course of treatment. Therefore, we ask that you please give us  2 business days to thoroughly review all your results before contacting the office for clarification. Should we see a critical lab result, you will be contacted sooner.     If You Need Anything After Your Visit   If you have any questions or concerns for your doctor, please call our main line at [phone]. If no one answers, please leave a voicemail as directed and we will return your call as soon as possible. Messages left after 4 pm will be answered the following business day.    You may also send us  a message via MyChart. We typically respond to MyChart messages within 1-2 business days.  For prescription refills, please ask your pharmacy to contact our office. Our fax number is [phone].  If you have an urgent issue when the clinic is closed that cannot wait until the next business day, you can page your doctor at the number below.     Please note that while we do our best to be available for urgent issues outside of office hours, we are not available 24/7.    If you have an urgent issue and are unable to reach us , you may choose to seek medical care at your doctor's office, retail clinic, urgent care center, or emergency room.   If you have a medical emergency, please immediately call 911 or go to the emergency department. In the event of inclement weather, please call our main line at [phone] for an update on the status of any delays or closures.  Dermatology Medication Tips: Please keep the boxes that topical medications come in in order to help keep track of the instructions about where and how to use these. Pharmacies typically print the medication instructions only on the boxes and not directly on the medication tubes.   If your medication is too expensive, please contact our office at  [phone] or send us  a message through MyChart.    We are unable to tell what your co-pay for medications will be in advance as this is different depending on your insurance coverage. However, we may be able to find a substitute medication at lower cost or fill out paperwork to get insurance to cover a needed medication.    If a prior authorization is required to get your medication covered by your insurance company, please allow us  1-2 business days to complete this process.   Drug prices often vary depending on where the prescription is filled and some pharmacies may offer cheaper prices.   The website www.goodrx.com contains coupons for medications through different pharmacies. The prices here do not account for what the cost may be with help from insurance (it may be cheaper with your insurance), but the website can give you the price if you did not use any insurance.  - You can print the associated coupon and take it with your prescription to the pharmacy.  - You may also stop by our office during regular business hours and pick up a GoodRx coupon card.  - If you need your prescription sent electronically to a  different pharmacy, notify our office through [facility] or by phone at [phone]

## 2024-08-27 LAB — SURGICAL PATHOLOGY

## 2024-08-31 ENCOUNTER — Ambulatory Visit: Payer: Self-pay | Admitting: Dermatology

## 2024-11-12 ENCOUNTER — Encounter: Payer: Self-pay | Admitting: Dermatology

## 2024-11-12 ENCOUNTER — Ambulatory Visit: Admitting: Dermatology

## 2024-11-12 VITALS — BP 132/80

## 2024-11-12 DIAGNOSIS — D485 Neoplasm of uncertain behavior of skin: Secondary | ICD-10-CM | POA: Diagnosis not present

## 2024-11-12 DIAGNOSIS — L111 Transient acantholytic dermatosis [Grover]: Secondary | ICD-10-CM | POA: Diagnosis not present

## 2024-11-12 DIAGNOSIS — D225 Melanocytic nevi of trunk: Secondary | ICD-10-CM | POA: Diagnosis not present

## 2024-11-12 NOTE — Patient Instructions (Signed)

## 2024-11-12 NOTE — Progress Notes (Signed)
" ° °  Follow-Up Visit   Subjective  Harry Lewis is a 59 y.o. male who presents for the following: Grover's disease (biopsied at his last appointment) follow up - He states the rash is much better. He is using [facility]MC 0.1% cream 2 weeks on and 2 weeks off. Last OV 08/26/2024   The following portions of the chart were reviewed this encounter and updated as appropriate: medications, allergies, medical history  Review of Systems:  No other skin or systemic complaints except as noted in HPI or Assessment and Plan.  Objective  Well appearing patient in no apparent distress; mood and affect are within normal limits.   A focused examination was performed of the following areas: Trunk   Relevant exam findings are noted in the Assessment and Plan.        Left medial pectoralis 6 mm pink papule   Assessment & Plan   Neoplasm of uncertain behavior of skin, left chest A 6 mm pink papule on the medial left pectoralis, present for approximately 20 years, with recent growth and increased itchiness. Differential diagnosis includes basal cell carcinoma (BCC) and irritated nevus. The lesion appears suspicious for [facility], but further evaluation is needed to confirm the diagnosis. - Performed shave biopsy of the lesion on the medial left pectoralis. - Sent biopsy sample for histopathological examination. - If results are benign, will send a message via MyChart. - If results are suspicious, will contact him for further discussion.  Grover's disease Biopsy-proven Grover's disease, currently well-controlled with triamcinolone . The rash has cleared significantly, with only a few tiny spots remaining, likely discolorations rather than active disease. - Discontinued triamcinolone  as the rash is clear. - Instructed to restart triamcinolone  if discolorations or rash reappear.  NEOPLASM OF UNCERTAIN BEHAVIOR OF SKIN Left medial pectoralis - Skin / nail biopsy Type of biopsy: tangential   Informed consent:  discussed and consent obtained   Timeout: patient name, date of birth, surgical site, and procedure verified   Procedure prep:  Patient was prepped and draped in usual sterile fashion Prep type:  Isopropyl alcohol Anesthesia: the lesion was anesthetized in a standard fashion   Anesthetic:  1% lidocaine w/ epinephrine 1-100,000 buffered w/ 8.4% NaHCO3 Instrument used: flexible razor blade   Hemostasis achieved with: pressure, aluminum chloride and electrodesiccation   Outcome: patient tolerated procedure well   Post-procedure details: sterile dressing applied and wound care instructions given   Dressing type: bandage and petrolatum    Specimen 1 - Surgical pathology Differential Diagnosis: BCC vs other   Check Margins: No  Return if symptoms worsen or fail to improve.  I, Roseline Hutchinson, CMA, am acting as scribe for [employer], DO .   Documentation: I have reviewed the above documentation for accuracy and completeness, and I agree with the above.  Delon Lenis, DO    "

## 2024-11-13 LAB — SURGICAL PATHOLOGY

## 2024-11-17 ENCOUNTER — Ambulatory Visit: Payer: Self-pay | Admitting: Dermatology

## 2024-12-09 ENCOUNTER — Ambulatory Visit
Admission: EM | Admit: 2024-12-09 | Discharge: 2024-12-09 | Disposition: A | Discharge status: Home / Self Care | Source: Home / Self Care | Attending: Family Medicine | Admitting: Family Medicine

## 2024-12-09 DIAGNOSIS — J309 Allergic rhinitis, unspecified: Secondary | ICD-10-CM | POA: Diagnosis not present

## 2024-12-09 DIAGNOSIS — J0100 Acute maxillary sinusitis, unspecified: Secondary | ICD-10-CM

## 2024-12-09 DIAGNOSIS — R059 Cough, unspecified: Secondary | ICD-10-CM | POA: Diagnosis not present

## 2024-12-09 DIAGNOSIS — R0981 Nasal congestion: Secondary | ICD-10-CM

## 2024-12-09 MED ORDER — HYDROCODONE BIT-HOMATROP MBR 5-1.5 MG/5ML PO SOLN: 5.0000 mL | Freq: Four times a day (QID) | ORAL | 0 refills | Status: AC | PRN

## 2024-12-09 MED ORDER — AZELASTINE HCL 0.1 % NA SOLN: 2.0000 | Freq: Two times a day (BID) | NASAL | 0 refills | Status: AC

## 2024-12-09 MED ORDER — PREDNISONE 20 MG PO TABS: ORAL_TABLET | ORAL | 0 refills | Status: AC

## 2024-12-09 MED ORDER — AMOXICILLIN-POT CLAVULANATE 875-125 MG PO TABS: 1.0000 | ORAL_TABLET | Freq: Two times a day (BID) | ORAL | 0 refills | Status: AC

## 2024-12-09 NOTE — Discharge Instructions (Addendum)
 Advised patient take medications as directed with food to completion.  Advised patient to take prednisone  with first dose of Augmentin  for the next 5 of 7 days.  Advised may use Astelin  nasal sprays twice daily for the next 5 days, then as needed for concurrent runny nose, postnasal drainage, nasal congestion.  Advised may use Hycodan cough syrup at night prior to sleep for cough due to sedative effects.  Encouraged to increase daily water intake to 64 ounces per day while taking these medications.  Advised if symptoms worsen and/or unresolved please follow-up with your PCP or here for further evaluation.

## 2024-12-09 NOTE — ED Provider Notes (Signed)
 " [facility]    CSN: [id number] Arrival date & time: 12/09/24  0808      History   Chief Complaint Chief Complaint  Patient presents with   Facial Pain   Nasal Congestion    HPI Harry Lewis is a 59 y.o. male.   HPI Pleasant 59 year old male presents with sinus nasal congestion, sinus pain/pressure and runny nose for 3 days.  Reports that OTC Zyrtec is ineffective.  PMH significant for moderate persistent asthma with acute exacerbation, GERD, and seborrheic keratosis  Past Medical History:  Diagnosis Date   Allergy    Arthritis    Asthma    GERD (gastroesophageal reflux disease)     Patient Active Problem List   Diagnosis Date Noted   Moderate persistent asthma with acute exacerbation 10/19/2021   Asthma 04/26/2010   SEBORRHEIC KERATOSIS 04/26/2010    Past Surgical History:  Procedure Laterality Date   NO PAST SURGERIES     wisdom teeth extration         Home Medications    Prior to Admission medications  Medication Sig Start Date End Date Taking? Authorizing Provider  amoxicillin -clavulanate (AUGMENTIN ) 875-125 MG tablet Take 1 tablet by mouth 2 (two) times daily. 12/09/24  Yes Teddy Sharper, FNP  azelastine  (ASTELIN ) 0.1 % nasal spray Place 2 sprays into both nostrils 2 (two) times daily. Use in each nostril as directed 12/09/24  Yes Teddy Sharper, FNP  HYDROcodone  bit-homatropine (HYCODAN) 5-1.5 MG/5ML syrup Take 5 mLs by mouth every 6 (six) hours as needed for cough. 12/09/24  Yes Teddy Sharper, FNP  predniSONE  (DELTASONE ) 20 MG tablet Take 3 tabs PO daily x 5 days. 12/09/24  Yes Teddy Sharper, FNP  cetirizine (ZYRTEC) 10 MG tablet Take 10 mg by mouth daily.    [provider]    Family History Family History  Problem Relation Age of Onset   Diabetes Mother    Stroke Mother    Throat cancer Father        smoker   Colon cancer Neg Hx    Colon polyps Neg Hx    Esophageal cancer Neg Hx    Rectal cancer Neg Hx    Stomach cancer  Neg Hx     Social History Social History[1]   Allergies   Patient has no known allergies.   Review of Systems Review of Systems  HENT:  Positive for sinus pressure and sinus pain.   All other systems reviewed and are negative.    Physical Exam Triage Vital Signs ED Triage Vitals [12/09/24 0822]  Encounter Vitals Group     BP (!) 135/99     Girls Systolic BP Percentile      Girls Diastolic BP Percentile      Boys Systolic BP Percentile      Boys Diastolic BP Percentile      Pulse Rate 76     Resp 16     Temp 98 F (36.7 C)     Temp Source Oral     SpO2 98 %     Weight      Height      Head Circumference      Peak Flow      Pain Score      Pain Loc      Pain Education      Exclude from Growth Chart    No data found.  Updated Vital Signs BP (!) 135/99 (BP Location: Right Arm)   Pulse 76  Temp 98 F (36.7 C) (Oral)   Resp 16   SpO2 98%   Visual Acuity Right Eye Distance:   Left Eye Distance:   Bilateral Distance:    Right Eye Near:   Left Eye Near:    Bilateral Near:     Physical Exam Vitals and nursing note reviewed.  Constitutional:      General: He is not in acute distress.    Appearance: Normal appearance. He is normal weight.  HENT:     Head: Normocephalic and atraumatic.     Right Ear: Tympanic membrane and external ear normal.     Left Ear: Tympanic membrane and external ear normal.     Ears:     Comments: Significant eustachian tube dysfunction noted bilaterally; a right EAC reveals tiny (< 1 mm) pimple/pustular abscess    Nose:     Right Sinus: Maxillary sinus tenderness present.     Left Sinus: Maxillary sinus tenderness present.     Comments: Turbinates are erythematous/edematous    Mouth/Throat:     Mouth: Mucous membranes are moist.     Pharynx: Oropharynx is clear.  Eyes:     Extraocular Movements: Extraocular movements intact.     Conjunctiva/sclera: Conjunctivae normal.     Pupils: Pupils are equal, round, and reactive to  light.  Cardiovascular:     Rate and Rhythm: Normal rate and regular rhythm.     Heart sounds: Normal heart sounds.  Pulmonary:     Effort: Pulmonary effort is normal.     Breath sounds: Normal breath sounds. No wheezing, rhonchi or rales.     Comments: Frequent nonproductive cough on exam Musculoskeletal:        General: Normal range of motion.  Skin:    General: Skin is warm and dry.  Neurological:     General: No focal deficit present.     Mental Status: He is alert and oriented to person, place, and time. Mental status is at baseline.  Psychiatric:        Mood and Affect: Mood normal.        Behavior: Behavior normal.      UC Treatments / Results  Labs (all labs ordered are listed, but only abnormal results are displayed) Labs Reviewed - No data to display  EKG   Radiology No results found.  Procedures Procedures (including critical care time)  Medications Ordered in UC Medications - No data to display  Initial Impression / Assessment and Plan / UC Course  I have reviewed the triage vital signs and the nursing notes.  Pertinent labs & imaging results that were available during my care of the patient were reviewed by me and considered in my medical decision making (see chart for details).     MDM: 1.  Acute maxillary sinusitis, recurrence not specified-Rx'd Augmentin  875/125 mg tablet: Take 1 tablet twice daily x 7 days; 2.  Nasal sinus congestion-Rx prednisone  20 mg tablet: Take 3 tablets p.o. daily x 5 days; 3.  Allergic rhinitis, unspecified seasonality, unspecified trigger-Rx'd Astelin  0.1% nasal spray: Place 2 sprays into both nostrils 2 times daily for the next 5 days; 4.  Cough, unspecified type-Rx'd Hycodan 5-1.5 mg / 5 mL syrup: Take 5 mL by mouth every 6 hours, as needed for cough. Advised patient take medications as directed with food to completion.  Advised patient to take prednisone  with first dose of Augmentin  for the next 5 of 7 days.  Advised may use  Astelin  nasal sprays twice  daily for the next 5 days, then as needed for concurrent runny nose, postnasal drainage, nasal congestion.  Advised may use Hycodan cough syrup at night prior to sleep for cough due to sedative effects.  Encouraged to increase daily water intake to 64 ounces per day while taking these medications.  Advised if symptoms worsen and/or unresolved please follow-up with your PCP or here for further evaluation.  Work note provided to patient per her request prior to discharge.  Patient discharged home, hemodynamically stable. Final Clinical Impressions(s) / UC Diagnoses   Final diagnoses:  Acute maxillary sinusitis, recurrence not specified  Allergic rhinitis, unspecified seasonality, unspecified trigger  Cough, unspecified type  Nasal sinus congestion     Discharge Instructions      Advised patient take medications as directed with food to completion.  Advised patient to take prednisone  with first dose of Augmentin  for the next 5 of 7 days.  Advised may use Astelin  nasal sprays twice daily for the next 5 days, then as needed for concurrent runny nose, postnasal drainage, nasal congestion.  Advised may use Hycodan cough syrup at night prior to sleep for cough due to sedative effects.  Encouraged to increase daily water intake to 64 ounces per day while taking these medications.  Advised if symptoms worsen and/or unresolved please follow-up with your PCP or here for further evaluation.     ED Prescriptions     Medication Sig Dispense Auth. Provider   amoxicillin -clavulanate (AUGMENTIN ) 875-125 MG tablet Take 1 tablet by mouth 2 (two) times daily. 14 tablet Harry Castagnola, FNP   predniSONE  (DELTASONE ) 20 MG tablet Take 3 tabs PO daily x 5 days. 15 tablet Harry Metheny, FNP   azelastine  (ASTELIN ) 0.1 % nasal spray Place 2 sprays into both nostrils 2 (two) times daily. Use in each nostril as directed 30 mL Teddy Sharper, FNP   HYDROcodone  bit-homatropine (HYCODAN) 5-1.5  MG/5ML syrup Take 5 mLs by mouth every 6 (six) hours as needed for cough. 120 mL Teddy Sharper, FNP      I have reviewed the PDMP during this encounter.    [1]  Social History Tobacco Use   Smoking status: Former    Current packs/day: 0.00    Types: Cigarettes    Quit date: 10/07/1988    Years since quitting: 36.1    Passive exposure: Never   Smokeless tobacco: Never  Vaping Use   Vaping status: Never Used  Substance Use Topics   Alcohol use: Yes    Alcohol/week: 6.0 standard drinks of alcohol    Types: 6 Cans of beer per week    Comment: 5/week   Drug use: No     Teddy Sharper, FNP 12/09/24 [phone]  "

## 2024-12-09 NOTE — ED Triage Notes (Signed)
 Pt has c/o runny nose, congestion, sinus pressure x 3 days. Pt has been taking benadryl, nyquil and zyrtec. Last dose of zyrtec this morning.
